# Patient Record
Sex: Male | Born: 1965 | Race: White | Hispanic: No | Marital: Married | Smoking: Never smoker
Health system: Southern US, Community
[De-identification: ages and names within clinical notes are randomized; demographics above are authoritative.]

## PROBLEM LIST (undated history)

## (undated) DIAGNOSIS — Z9689 Presence of other specified functional implants: Secondary | ICD-10-CM

## (undated) DIAGNOSIS — M1990 Unspecified osteoarthritis, unspecified site: Secondary | ICD-10-CM

## (undated) DIAGNOSIS — F329 Major depressive disorder, single episode, unspecified: Secondary | ICD-10-CM

## (undated) DIAGNOSIS — N529 Male erectile dysfunction, unspecified: Secondary | ICD-10-CM

## (undated) DIAGNOSIS — Z9889 Other specified postprocedural states: Secondary | ICD-10-CM

## (undated) DIAGNOSIS — I890 Lymphedema, not elsewhere classified: Secondary | ICD-10-CM

## (undated) DIAGNOSIS — F988 Other specified behavioral and emotional disorders with onset usually occurring in childhood and adolescence: Secondary | ICD-10-CM

## (undated) DIAGNOSIS — R351 Nocturia: Secondary | ICD-10-CM

## (undated) DIAGNOSIS — N400 Enlarged prostate without lower urinary tract symptoms: Secondary | ICD-10-CM

## (undated) DIAGNOSIS — F319 Bipolar disorder, unspecified: Secondary | ICD-10-CM

## (undated) DIAGNOSIS — I10 Essential (primary) hypertension: Secondary | ICD-10-CM

## (undated) DIAGNOSIS — G629 Polyneuropathy, unspecified: Secondary | ICD-10-CM

## (undated) DIAGNOSIS — K219 Gastro-esophageal reflux disease without esophagitis: Secondary | ICD-10-CM

## (undated) DIAGNOSIS — G4730 Sleep apnea, unspecified: Secondary | ICD-10-CM

## (undated) DIAGNOSIS — M5030 Other cervical disc degeneration, unspecified cervical region: Secondary | ICD-10-CM

## (undated) DIAGNOSIS — E785 Hyperlipidemia, unspecified: Secondary | ICD-10-CM

## (undated) DIAGNOSIS — F419 Anxiety disorder, unspecified: Secondary | ICD-10-CM

## (undated) DIAGNOSIS — F32A Depression, unspecified: Secondary | ICD-10-CM

## (undated) DIAGNOSIS — E291 Testicular hypofunction: Secondary | ICD-10-CM

## (undated) DIAGNOSIS — E669 Obesity, unspecified: Secondary | ICD-10-CM

## (undated) DIAGNOSIS — B356 Tinea cruris: Secondary | ICD-10-CM

## (undated) DIAGNOSIS — M109 Gout, unspecified: Secondary | ICD-10-CM

## (undated) HISTORY — DX: Hyperlipidemia, unspecified: E78.5

## (undated) HISTORY — DX: Testicular hypofunction: E29.1

## (undated) HISTORY — DX: Gout, unspecified: M10.9

## (undated) HISTORY — PX: TOE SURGERY: SHX1073

## (undated) HISTORY — PX: STOMACH SURGERY: SHX791

## (undated) HISTORY — DX: Bipolar disorder, unspecified: F31.9

## (undated) HISTORY — DX: Benign prostatic hyperplasia without lower urinary tract symptoms: N40.0

## (undated) HISTORY — DX: Tinea cruris: B35.6

## (undated) HISTORY — DX: Sleep apnea, unspecified: G47.30

## (undated) HISTORY — DX: Gastro-esophageal reflux disease without esophagitis: K21.9

## (undated) HISTORY — PX: JOINT REPLACEMENT: SHX530

## (undated) HISTORY — PX: BACK SURGERY: SHX140

## (undated) HISTORY — DX: Essential (primary) hypertension: I10

## (undated) HISTORY — DX: Other specified behavioral and emotional disorders with onset usually occurring in childhood and adolescence: F98.8

## (undated) HISTORY — DX: Unspecified osteoarthritis, unspecified site: M19.90

## (undated) HISTORY — DX: Other cervical disc degeneration, unspecified cervical region: M50.30

## (undated) HISTORY — PX: LUMBAR DISC SURGERY: SHX700

## (undated) HISTORY — DX: Obesity, unspecified: E66.9

## (undated) HISTORY — DX: Male erectile dysfunction, unspecified: N52.9

## (undated) HISTORY — DX: Nocturia: R35.1

## (undated) HISTORY — PX: CARDIAC CATHETERIZATION: SHX172

## (undated) HISTORY — PX: WRIST SURGERY: SHX841

---

## 2005-04-17 ENCOUNTER — Emergency Department: Payer: Self-pay | Admitting: Emergency Medicine

## 2005-05-16 ENCOUNTER — Ambulatory Visit: Payer: Self-pay | Admitting: Pain Medicine

## 2005-05-24 ENCOUNTER — Ambulatory Visit: Payer: Self-pay | Admitting: Pain Medicine

## 2005-06-05 ENCOUNTER — Ambulatory Visit: Payer: Self-pay | Admitting: Pain Medicine

## 2005-07-03 ENCOUNTER — Ambulatory Visit: Payer: Self-pay | Admitting: Pain Medicine

## 2005-07-20 ENCOUNTER — Ambulatory Visit: Payer: Self-pay | Admitting: Physician Assistant

## 2005-07-24 ENCOUNTER — Ambulatory Visit: Payer: Self-pay | Admitting: Pain Medicine

## 2005-08-22 ENCOUNTER — Ambulatory Visit: Payer: Self-pay | Admitting: Pain Medicine

## 2005-08-24 ENCOUNTER — Ambulatory Visit: Payer: Self-pay | Admitting: Pain Medicine

## 2005-09-06 ENCOUNTER — Ambulatory Visit: Payer: Self-pay | Admitting: Pain Medicine

## 2005-09-25 ENCOUNTER — Ambulatory Visit: Payer: Self-pay | Admitting: Pain Medicine

## 2005-10-26 ENCOUNTER — Ambulatory Visit: Payer: Self-pay | Admitting: Pain Medicine

## 2005-11-21 ENCOUNTER — Ambulatory Visit: Payer: Self-pay | Admitting: Pain Medicine

## 2005-11-27 ENCOUNTER — Ambulatory Visit: Payer: Self-pay | Admitting: Pain Medicine

## 2005-12-21 ENCOUNTER — Ambulatory Visit: Payer: Self-pay | Admitting: Pain Medicine

## 2005-12-25 ENCOUNTER — Ambulatory Visit: Payer: Self-pay | Admitting: Pain Medicine

## 2006-01-23 ENCOUNTER — Ambulatory Visit: Payer: Self-pay | Admitting: Pain Medicine

## 2006-01-31 ENCOUNTER — Ambulatory Visit: Payer: Self-pay | Admitting: Pain Medicine

## 2006-02-22 ENCOUNTER — Ambulatory Visit: Payer: Self-pay | Admitting: Pain Medicine

## 2006-02-28 ENCOUNTER — Ambulatory Visit: Payer: Self-pay | Admitting: Pain Medicine

## 2006-03-07 ENCOUNTER — Ambulatory Visit: Payer: Self-pay | Admitting: Pain Medicine

## 2006-03-22 ENCOUNTER — Ambulatory Visit: Payer: Self-pay | Admitting: Pain Medicine

## 2006-04-02 ENCOUNTER — Ambulatory Visit: Payer: Self-pay | Admitting: Pain Medicine

## 2006-04-24 ENCOUNTER — Ambulatory Visit: Payer: Self-pay | Admitting: Pain Medicine

## 2006-05-24 ENCOUNTER — Ambulatory Visit: Payer: Self-pay | Admitting: Pain Medicine

## 2006-06-04 ENCOUNTER — Ambulatory Visit: Payer: Self-pay | Admitting: Pain Medicine

## 2006-06-19 ENCOUNTER — Ambulatory Visit: Payer: Self-pay | Admitting: Pain Medicine

## 2006-07-19 ENCOUNTER — Ambulatory Visit: Payer: Self-pay | Admitting: Pain Medicine

## 2006-07-30 ENCOUNTER — Ambulatory Visit: Payer: Self-pay | Admitting: Pain Medicine

## 2006-08-23 ENCOUNTER — Ambulatory Visit: Payer: Self-pay | Admitting: Pain Medicine

## 2006-09-05 ENCOUNTER — Ambulatory Visit: Payer: Self-pay | Admitting: Pain Medicine

## 2006-09-06 ENCOUNTER — Ambulatory Visit: Payer: Self-pay | Admitting: Family Medicine

## 2006-09-18 ENCOUNTER — Ambulatory Visit: Payer: Self-pay | Admitting: Pain Medicine

## 2006-10-16 ENCOUNTER — Ambulatory Visit: Payer: Self-pay | Admitting: Pain Medicine

## 2006-10-24 ENCOUNTER — Ambulatory Visit: Payer: Self-pay | Admitting: Pain Medicine

## 2006-11-13 ENCOUNTER — Ambulatory Visit: Payer: Self-pay | Admitting: Pain Medicine

## 2006-12-18 ENCOUNTER — Ambulatory Visit: Payer: Self-pay | Admitting: Pain Medicine

## 2006-12-26 ENCOUNTER — Ambulatory Visit: Payer: Self-pay | Admitting: Pain Medicine

## 2007-01-17 ENCOUNTER — Ambulatory Visit: Payer: Self-pay | Admitting: Pain Medicine

## 2007-01-21 ENCOUNTER — Ambulatory Visit: Payer: Self-pay | Admitting: Pain Medicine

## 2007-02-12 ENCOUNTER — Ambulatory Visit: Payer: Self-pay | Admitting: Pain Medicine

## 2007-03-14 ENCOUNTER — Ambulatory Visit: Payer: Self-pay | Admitting: Pain Medicine

## 2007-04-10 ENCOUNTER — Ambulatory Visit: Payer: Self-pay | Admitting: Pain Medicine

## 2007-05-09 ENCOUNTER — Ambulatory Visit: Payer: Self-pay | Admitting: Pain Medicine

## 2007-05-13 ENCOUNTER — Ambulatory Visit: Payer: Self-pay | Admitting: Pain Medicine

## 2007-06-13 ENCOUNTER — Ambulatory Visit: Payer: Self-pay | Admitting: Pain Medicine

## 2007-07-17 ENCOUNTER — Ambulatory Visit: Payer: Self-pay | Admitting: Pain Medicine

## 2007-08-13 ENCOUNTER — Ambulatory Visit: Payer: Self-pay | Admitting: Pain Medicine

## 2007-09-10 ENCOUNTER — Ambulatory Visit: Payer: Self-pay | Admitting: Pain Medicine

## 2007-09-18 ENCOUNTER — Ambulatory Visit: Payer: Self-pay | Admitting: Pain Medicine

## 2007-09-30 ENCOUNTER — Ambulatory Visit: Payer: Self-pay | Admitting: Pain Medicine

## 2007-10-10 ENCOUNTER — Ambulatory Visit: Payer: Self-pay | Admitting: Pain Medicine

## 2007-10-18 ENCOUNTER — Ambulatory Visit: Payer: Self-pay | Admitting: Pain Medicine

## 2007-11-12 ENCOUNTER — Ambulatory Visit: Payer: Self-pay | Admitting: Pain Medicine

## 2007-12-09 ENCOUNTER — Ambulatory Visit: Payer: Self-pay | Admitting: Internal Medicine

## 2007-12-16 ENCOUNTER — Ambulatory Visit: Payer: Self-pay | Admitting: Pain Medicine

## 2008-01-09 ENCOUNTER — Ambulatory Visit: Payer: Self-pay | Admitting: Pain Medicine

## 2008-01-13 ENCOUNTER — Ambulatory Visit: Payer: Self-pay | Admitting: Pain Medicine

## 2008-01-24 ENCOUNTER — Other Ambulatory Visit: Payer: Self-pay

## 2008-01-24 ENCOUNTER — Ambulatory Visit: Payer: Self-pay | Admitting: Unknown Physician Specialty

## 2008-01-31 ENCOUNTER — Ambulatory Visit: Payer: Self-pay | Admitting: Unknown Physician Specialty

## 2008-02-11 ENCOUNTER — Ambulatory Visit: Payer: Self-pay | Admitting: Pain Medicine

## 2009-02-25 ENCOUNTER — Ambulatory Visit: Payer: Self-pay | Admitting: Unknown Physician Specialty

## 2009-08-18 ENCOUNTER — Ambulatory Visit: Payer: Self-pay | Admitting: Pain Medicine

## 2009-09-09 ENCOUNTER — Ambulatory Visit: Payer: Self-pay | Admitting: Pain Medicine

## 2009-09-13 ENCOUNTER — Ambulatory Visit: Payer: Self-pay | Admitting: Pain Medicine

## 2009-10-05 ENCOUNTER — Ambulatory Visit: Payer: Self-pay | Admitting: Pain Medicine

## 2009-10-11 ENCOUNTER — Ambulatory Visit: Payer: Self-pay | Admitting: Pain Medicine

## 2009-11-01 ENCOUNTER — Ambulatory Visit: Payer: Self-pay | Admitting: Pain Medicine

## 2009-11-30 ENCOUNTER — Ambulatory Visit: Payer: Self-pay | Admitting: Pain Medicine

## 2009-12-01 ENCOUNTER — Ambulatory Visit: Payer: Self-pay | Admitting: Pain Medicine

## 2009-12-30 ENCOUNTER — Ambulatory Visit: Payer: Self-pay | Admitting: Pain Medicine

## 2010-01-03 ENCOUNTER — Ambulatory Visit: Payer: Self-pay | Admitting: Pain Medicine

## 2010-01-31 ENCOUNTER — Ambulatory Visit: Payer: Self-pay | Admitting: Pain Medicine

## 2010-03-01 ENCOUNTER — Ambulatory Visit: Payer: Self-pay | Admitting: Pain Medicine

## 2010-03-31 ENCOUNTER — Ambulatory Visit: Payer: Self-pay | Admitting: Pain Medicine

## 2010-04-01 ENCOUNTER — Ambulatory Visit: Payer: Self-pay

## 2010-04-06 ENCOUNTER — Ambulatory Visit: Payer: Self-pay | Admitting: Pain Medicine

## 2010-05-05 ENCOUNTER — Ambulatory Visit: Payer: Self-pay | Admitting: Pain Medicine

## 2010-05-10 ENCOUNTER — Ambulatory Visit: Payer: Self-pay | Admitting: Unknown Physician Specialty

## 2010-05-19 ENCOUNTER — Ambulatory Visit: Payer: Self-pay | Admitting: Unknown Physician Specialty

## 2010-06-12 ENCOUNTER — Ambulatory Visit: Payer: Self-pay | Admitting: Internal Medicine

## 2011-04-27 ENCOUNTER — Ambulatory Visit: Payer: Self-pay | Admitting: Surgery

## 2012-08-14 HISTORY — PX: TOTAL KNEE ARTHROPLASTY: SHX125

## 2012-09-17 ENCOUNTER — Ambulatory Visit: Payer: Self-pay | Admitting: Cardiology

## 2012-10-07 ENCOUNTER — Ambulatory Visit: Payer: Self-pay | Admitting: Family Medicine

## 2012-10-07 DIAGNOSIS — K760 Fatty (change of) liver, not elsewhere classified: Secondary | ICD-10-CM | POA: Insufficient documentation

## 2012-10-07 DIAGNOSIS — I2510 Atherosclerotic heart disease of native coronary artery without angina pectoris: Secondary | ICD-10-CM | POA: Insufficient documentation

## 2012-10-23 ENCOUNTER — Ambulatory Visit: Payer: Self-pay | Admitting: Gastroenterology

## 2013-05-05 ENCOUNTER — Encounter: Payer: Self-pay | Admitting: Orthopedic Surgery

## 2013-06-04 ENCOUNTER — Ambulatory Visit: Payer: Self-pay | Admitting: Orthopedic Surgery

## 2013-09-28 ENCOUNTER — Inpatient Hospital Stay: Payer: Self-pay | Admitting: Student

## 2013-09-28 LAB — CBC
HCT: 43.8 % (ref 40.0–52.0)
HGB: 14.7 g/dL (ref 13.0–18.0)
MCH: 31.6 pg (ref 26.0–34.0)
MCHC: 33.7 g/dL (ref 32.0–36.0)
MCV: 94 fL (ref 80–100)
PLATELETS: 361 10*3/uL (ref 150–440)
RBC: 4.66 10*6/uL (ref 4.40–5.90)
RDW: 13.6 % (ref 11.5–14.5)
WBC: 9.7 10*3/uL (ref 3.8–10.6)

## 2013-09-28 LAB — COMPREHENSIVE METABOLIC PANEL
ALBUMIN: 3.3 g/dL — AB (ref 3.4–5.0)
ALT: 28 U/L (ref 12–78)
Alkaline Phosphatase: 53 U/L
Anion Gap: 5 — ABNORMAL LOW (ref 7–16)
BUN: 8 mg/dL (ref 7–18)
Bilirubin,Total: 0.5 mg/dL (ref 0.2–1.0)
CREATININE: 1.19 mg/dL (ref 0.60–1.30)
Calcium, Total: 9.4 mg/dL (ref 8.5–10.1)
Chloride: 101 mmol/L (ref 98–107)
Co2: 27 mmol/L (ref 21–32)
EGFR (African American): >60
EGFR (Non-African Amer.): >60
Glucose: 109 mg/dL — ABNORMAL HIGH (ref 65–99)
Osmolality: 265 (ref 275–301)
POTASSIUM: 4.1 mmol/L (ref 3.5–5.1)
SGOT(AST): 29 U/L (ref 15–37)
SODIUM: 133 mmol/L — AB (ref 136–145)
TOTAL PROTEIN: 7.9 g/dL (ref 6.4–8.2)

## 2013-09-29 LAB — CBC WITH DIFFERENTIAL/PLATELET
BASOS PCT: 1.6 %
Basophil #: 0.1 10*3/uL (ref 0.0–0.1)
Eosinophil #: 0.5 10*3/uL (ref 0.0–0.7)
Eosinophil %: 6.6 %
HCT: 41.3 % (ref 40.0–52.0)
HGB: 14.2 g/dL (ref 13.0–18.0)
LYMPHS ABS: 1.8 10*3/uL (ref 1.0–3.6)
Lymphocyte %: 22.5 %
MCH: 32.3 pg (ref 26.0–34.0)
MCHC: 34.3 g/dL (ref 32.0–36.0)
MCV: 94 fL (ref 80–100)
MONO ABS: 0.6 x10 3/mm (ref 0.2–1.0)
Monocyte %: 8 %
Neutrophil #: 4.9 10*3/uL (ref 1.4–6.5)
Neutrophil %: 61.3 %
PLATELETS: 339 10*3/uL (ref 150–440)
RBC: 4.39 10*6/uL — ABNORMAL LOW (ref 4.40–5.90)
RDW: 13.4 % (ref 11.5–14.5)
WBC: 8 10*3/uL (ref 3.8–10.6)

## 2013-09-29 LAB — BASIC METABOLIC PANEL
ANION GAP: 6 — AB (ref 7–16)
BUN: 7 mg/dL (ref 7–18)
CO2: 25 mmol/L (ref 21–32)
Calcium, Total: 8.8 mg/dL (ref 8.5–10.1)
Chloride: 104 mmol/L (ref 98–107)
Creatinine: 1.04 mg/dL (ref 0.60–1.30)
EGFR (Non-African Amer.): >60
GLUCOSE: 129 mg/dL — AB (ref 65–99)
Osmolality: 270 (ref 275–301)
Potassium: 4 mmol/L (ref 3.5–5.1)
Sodium: 135 mmol/L — ABNORMAL LOW (ref 136–145)

## 2013-10-03 LAB — CULTURE, BLOOD (SINGLE)

## 2013-10-13 DIAGNOSIS — G4733 Obstructive sleep apnea (adult) (pediatric): Secondary | ICD-10-CM | POA: Insufficient documentation

## 2013-10-21 ENCOUNTER — Emergency Department: Payer: Self-pay | Admitting: Emergency Medicine

## 2014-02-23 DIAGNOSIS — I890 Lymphedema, not elsewhere classified: Secondary | ICD-10-CM | POA: Insufficient documentation

## 2014-06-12 ENCOUNTER — Ambulatory Visit: Payer: Self-pay | Admitting: Gastroenterology

## 2014-08-28 ENCOUNTER — Ambulatory Visit: Payer: Self-pay

## 2014-09-18 ENCOUNTER — Ambulatory Visit (INDEPENDENT_AMBULATORY_CARE_PROVIDER_SITE_OTHER): Payer: BC Managed Care – PPO | Admitting: Podiatry

## 2014-09-18 ENCOUNTER — Encounter: Payer: Self-pay | Admitting: Podiatry

## 2014-09-18 ENCOUNTER — Ambulatory Visit: Payer: Self-pay

## 2014-09-18 ENCOUNTER — Ambulatory Visit (INDEPENDENT_AMBULATORY_CARE_PROVIDER_SITE_OTHER): Payer: BC Managed Care – PPO

## 2014-09-18 VITALS — BP 152/89 | HR 96 | Resp 16

## 2014-09-18 DIAGNOSIS — M79676 Pain in unspecified toe(s): Secondary | ICD-10-CM

## 2014-09-18 DIAGNOSIS — L84 Corns and callosities: Secondary | ICD-10-CM

## 2014-09-18 DIAGNOSIS — M2042 Other hammer toe(s) (acquired), left foot: Secondary | ICD-10-CM

## 2014-09-20 NOTE — Progress Notes (Signed)
Subjective:     Patient ID: Rodney Howard Rodney Howard, male   DOB: 1966-02-09, 49 y.o.   MRN: [id number]  HPI patient presents with chronic lesions at the tips of the third and fourth toe left over right foot was structural deviation of the digits noted.   Review of Systems     Objective:   Physical Exam No change neurovascular status significant structural deformity of the lesser digits noted with distal contracture and distal keratotic lesion left over right    Assessment:     Hammertoe deformity with chronic keratotic lesion secondary to foot structure    Plan:     Educated on condition and consideration at one point for digital fusion. Today debrided the lesions fully and applied buttress pad to try to lift the toes and we will see how this does for her. Reappoint to recheck in several months

## 2014-11-16 ENCOUNTER — Encounter: Payer: Self-pay | Admitting: Podiatry

## 2014-11-16 ENCOUNTER — Ambulatory Visit (INDEPENDENT_AMBULATORY_CARE_PROVIDER_SITE_OTHER): Payer: BC Managed Care – PPO | Admitting: Podiatry

## 2014-11-16 VITALS — BP 151/91 | HR 86 | Resp 16 | Ht 75.0 in | Wt 360.0 lb

## 2014-11-16 DIAGNOSIS — L600 Ingrowing nail: Secondary | ICD-10-CM | POA: Diagnosis not present

## 2014-11-16 NOTE — Progress Notes (Signed)
He presents today with chief complaint of a painful ingrown toenail fibular border of the second digit right foot. He denies fever chills nausea vomiting muscle aches and pains. He does state that he is 1 week away from having a total knee replaced in his left leg.  Objective: Vital signs are stable he is alert and oriented 3 pulses to the right foot is intact there is mild erythema with a sharp incurvated nail margin along the fibular border of the second digit right foot. No purulence no malodor no drainage.  Assessment: Ingrown nail margin. Second digit right foot.  Plan: I removed the nail margin as a slant back second digit right foot because he is planning to have a knee arthroplasty we cannot risk a matrixectomy at this point. I will have him back in the future after he recovers from his knee replacement for matrixectomy.

## 2014-12-05 NOTE — Discharge Summary (Signed)
PATIENT NAME:  Rodney Howard, [name] MR#:  [id number] DATE OF BIRTH:  17-Apr-1966  DATE OF ADMISSION:  09/28/2013 DATE OF DISCHARGE:  09/30/2013  DISPOSITION:  Home.   DIAGNOSIS:  Cellulitis of the left lower extremity.   MEDICATIONS AT DISCHARGE:  Aspirin 81 mg daily, aripiprazole 15 mg daily, amitriptyline 200 mg daily, metoprolol 100 mg daily, Norco 10/325 mg one every 4 hours as needed for pain, tranylcypromine 10 mg take 2 tablets once a day. OxyContin 20 mg extended release every 12 hours, gabapentin 300 mg once a day, amoxicillin 875 mg twice daily for 10 days, and Cleocin 150 mg once a day.  FOLLOWUP:  Primary care physician, Dr. Rolm GalaHeidi Grandis.  HOSPITAL COURSE:  This is a very nice 49 year old gentleman with history of developing redness on the level of the ankle of the left lower extremity. No history of trauma. Erythema getting worse with accompany of edema, for which the patient went to urgent care. He received some Keflex. Keflex did not work, had cellulitic progression. The patient was sent to the Emergency Department. In the Emergency Department, he had an ultrasound of the lower extremities that did not show any significant DVTs. He has had a heart rate of 91 and a white blood cell count of 9.7. The patient was admitted for treatment of cellulitis. He was started on cefazolin and had significant improvement through his hospitalization. The patient is discharged on amoxicillin and Cleocin, as he was treated with Keflex previously with trimethoprim-sulfamethoxazole as an outpatient. I was going to give him doxycycline, but the patient had a previous stomach upset. The patient is discharged in the company of his wife. I spent about 35 minutes with the discharge today.    ____________________________ Felipa Furnaceoberto Sanchez Gutierrez, MD rsg:ms D: 09/30/2013 23:18:45 ET T: 09/30/2013 23:42:05 ET JOB#: [id number]  cc: Felipa Furnaceoberto Sanchez Gutierrez, MD, <Dictator> Letitia CaulHeidi M. Grandis, MD  [name] ELECTRONICALLY SIGNED 10/04/2013 20:34

## 2014-12-05 NOTE — H&P (Signed)
PATIENT NAME:  Rodney Howard, Rodney Howard MR#:  [id number] DATE OF BIRTH:  1966-05-06  DATE OF ADMISSION:  09/28/2013  PRIMARY CARE PHYSICIAN: Rodney CaulHeidi M. Grandis, MD  REFERRING PHYSICIAN: Su Leyobert L. Kinner, MD  CHIEF COMPLAINT: Left leg swelling.   HISTORY OF PRESENT ILLNESS:  The patient is a pleasant 49 year old male who initially started to have some redness above the ankle area on the left lower extremity. There is no trauma, insect bites or open sores but the redness increased and the following day patient had increased swelling. He went to an urgent care center and was started on Keflex.  The patient took 2 tablets, went to the PMD the next day who stopped the Keflex and put the patient on Bactrim. The patient has had Bactrim since Thursday but the redness is increasing. There is increased swelling. There is no open drainage but the patient feels fullness as well and low-grade fevers, chills. He came into the hospital. He was noted to have significant redness and warmth, was given Ancef and blood cultures were obtained and hospitalist services were contacted for further evaluation and management. The patient denies having any long flights or history of DVT or PEs in the past. The patient denies having any increased periods of inactivity more so than baseline but is unemployed, stays at home most of the time.   PAST MEDICAL HISTORY:  1.  History of chronic back pain status post remote multiple surgeries.  2.  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  3.  Hypertension.  4.  Hyperlipidemia.  5.  Depression.  6.  Gout.  7.  Obstructive sleep apnea, on CPAP.   SOCIAL HISTORY: No tobacco, alcohol or drug use. Is unemployed.   FAMILY HISTORY:  Diabetes, hypertension, cirrhosis and stroke running in the family.   OUTPATIENT MEDICATIONS:  Acetaminophen/hydrocodone 325/10 mg 1 tab every 4 hours as needed for pain, Afrin p.r.n. at bedtime, aripiprazole 15 mg at  bedtime, aspirin 81 mg daily, calcium plus vitamin Howard 2 tabs once a day, gabapentin 300 mg at bedtime, lamotrigine 200 mg at bedtime, metoprolol extended-release 100 mg once a day, omeprazole 40 mg daily, OxyContin 20 mg 1 tab every 12 hours, Provigil 200 mg 2 times a day. Tranylcypromine 10 mg tablets 20 mg in the morning, 20 mg in the afternoon, 40 mg at bedtime.   REVIEW OF SYSTEMS: CONSTITUTIONAL: About a 10-pound weight loss in the last couple of weeks, low-grade fevers and some chills.  EYES: No blurry vision or double vision.  ENT: No tinnitus or hearing loss.  RESPIRATORY: No cough, wheezing, shortness of breath, dyspnea on exertion or painful respirations.  CARDIOVASCULAR: No chest pain, no orthopnea. Has obstructive sleep apnea.  GASTROINTESTINAL: Positive for nausea. No vomiting, diarrhea or abdominal pain. No bloody stools or dark stools.  GENITOURINARY: Denies dysuria, hematuria.  HEMATOLOGIC AND LYMPHATIC: No anemia or easy bruising.  SKIN: Positive for rash as described above.  MUSCULOSKELETAL: Has chronic arthritis and back pain.  NEUROLOGIC: No focal weakness or numbness.  PSYCHIATRIC:  Has depression.   PHYSICAL EXAMINATION: VITAL SIGNS: Temperature on arrival 98, pulse rate 91, respiratory rate 18, blood pressure 161/76, O2 sat 99% on room air.  GENERAL: The patient is an obese male lying in bed, no obvious distress.  HEENT: Normocephalic, atraumatic. Pupils are equal and reactive. Anicteric sclerae, moist mucous membranes.  NECK: Supple. No thyroid tenderness. No cervical lymphadenopathy.  CARDIOVASCULAR: S1, S2 regular. No significant murmurs, rubs or  gallops.  LUNGS: Clear to auscultation without wheezing, rhonchi or rales.  ABDOMEN: Soft, nontender.  EXTREMITIES: The patient has significant rash, edema and warmth starting in the foot area and going behind the shin area but it is mostly below the knee. There is no open drainage. This area is warm.  NEUROLOGIC: No focal  weakness on numbness. Strength is 5 out of 5 in all extremities. Cranial nerves II through XII grossly intact.  PSYCHIATRIC: Awake, alert, oriented x 3.   LABORATORY, DIAGNOSTIC AND RADIOLOGIC DATA: White count of 9.7, hemoglobin 14.7, platelets 361. LFTs: Albumin was 3.3, otherwise within normal limits. BUN 8, creatinine 1.19, sodium 133, potassium 4.1. Tib-fib x-ray on the left no acute abnormalities.   ASSESSMENT AND PLAN: We have a pleasant 49 year old male with depression, hypertension, hyperlipidemia, who comes in with a rash. He does have history of gout but has no significant joint pains. He did state he took colchicine and swelling and redness did not go away. This rash is more consistent with cellulitis. Would start the patient on Ancef and obtain blood cultures. I suspect the patient did not take enough Keflex and then the medication was changed to Bactrim. He does have history of staph which was likely not methicillin-resistant Staphylococcus aureus as it did improved with Keflex per patient in the past. Would follow with the cultures. Would mark the area at this point for tracking purposes and also obtain ultrasound of the lower extremity on the left to rule out deep vein thrombosis. Would continue the depression medications at this time but I suspect the patient needs IV antibiotics. Would trend the redness through serial exams. The patient is full code.   TOTAL TIME SPENT: Is 45 minutes.    ____________________________ Rodney Eaton, MD sa:cs Howard: 09/28/2013 14:41:53 ET T: 09/28/2013 15:02:45 ET JOB#: [id number]  cc: Rodney Eaton, MD, <Dictator> Rodney Eaton MD ELECTRONICALLY SIGNED 10/17/2013 13:02

## 2015-03-16 ENCOUNTER — Other Ambulatory Visit: Payer: BC Managed Care – PPO

## 2015-03-16 DIAGNOSIS — E291 Testicular hypofunction: Secondary | ICD-10-CM

## 2015-03-17 ENCOUNTER — Telehealth: Payer: Self-pay | Admitting: *Deleted

## 2015-03-17 LAB — TESTOSTERONE: TESTOSTERONE: 308 ng/dL — AB (ref 348–1197)

## 2015-03-17 NOTE — Telephone Encounter (Signed)
Pt returned call to get specific testosterone level (308).

## 2015-03-17 NOTE — Telephone Encounter (Signed)
Left message w/pt's son Leta Jungling) asking him to have his father call - will call pt's mobile #.

## 2015-03-17 NOTE — Telephone Encounter (Signed)
Left a message on the patient's vm relaying the results of their recent labs.  Contact information was also given so that the patient may call back if they have any questions. 

## 2015-03-17 NOTE — Telephone Encounter (Signed)
-----   Message from Harle Battiest, PA-C sent at 03/17/2015  8:29 AM EDT ----- Testosterone is low.  He has an appointment on the 16th.  We can discuss this at the appointment.

## 2015-03-24 ENCOUNTER — Other Ambulatory Visit: Payer: Self-pay | Admitting: *Deleted

## 2015-03-24 ENCOUNTER — Encounter: Payer: Self-pay | Admitting: *Deleted

## 2015-03-30 ENCOUNTER — Encounter: Payer: Self-pay | Admitting: Urology

## 2015-03-30 ENCOUNTER — Ambulatory Visit (INDEPENDENT_AMBULATORY_CARE_PROVIDER_SITE_OTHER): Payer: BC Managed Care – PPO | Admitting: Urology

## 2015-03-30 VITALS — BP 121/75 | HR 93 | Ht 74.0 in | Wt 345.9 lb

## 2015-03-30 DIAGNOSIS — N400 Enlarged prostate without lower urinary tract symptoms: Secondary | ICD-10-CM | POA: Diagnosis not present

## 2015-03-30 DIAGNOSIS — E291 Testicular hypofunction: Secondary | ICD-10-CM

## 2015-03-30 LAB — BLADDER SCAN AMB NON-IMAGING: Scan Result: 0

## 2015-03-30 MED ORDER — TESTOSTERONE CYPIONATE 200 MG/ML IM SOLN
200.0000 mg | Freq: Once | INTRAMUSCULAR | Status: AC
  Administered 2015-03-30: 200 mg via INTRAMUSCULAR

## 2015-03-30 NOTE — Progress Notes (Signed)
03/30/2015 4:23 PM   Rodney Howard July 08, 1966 [id number]  Referring provider: Rolm Gala, MD [address] [place], [place] [address]  Chief Complaint  Patient presents with  . Hypogonadism    recheck testosterone  . Benign Prostatic Hypertrophy    HPI: Rodney Howard is a 49 year old white male with hypogonadism who presents today to discuss his current mode of therapy and its effectiveness.    The patient has been tried on gels, long acting testosterone injections, pellets and testosterone cypionate. He most recently had been on AndroGel after switching therapies from the short acting testosterone injections due to an upcoming knee surgery. Patient felt he would not be able to return to the clinic for his injections while recovering from the knee surgery.   He is now fully recovered from the surgery and does not feel that the topical treatment has been effective.  His current serum testosterone level was 308 ng/dL drawn on 19/14/7829 at 8:30 in the morning. He has filled out an ADAM questionnaire and has answered yes to all questions, for the exception of a decrease in strength and loss of height. His major concern today is his lack of energy. He stated he felt the best on the short acting testosterone injections that he would like to return to that treatment modality.      Androgen Deficiency in the Aging Male      03/30/15 1600       Androgen Deficiency in the Aging Male   Do you have a decrease in libido (sex drive) Yes     Do you have lack of energy Yes     Do you have a decrease in strength and/or endurance No     Have you lost height No     Have you noticed a decreased "enjoyment of life" Yes     Are you sad and/or grumpy Yes     Are your erections less strong Yes     Have you noticed a recent deterioration in your ability to play sports Yes     Are you falling asleep after dinner Yes     Has there been a recent deterioration in your work performance Yes          Patient is currently scheduled to undergo lap band surgery next week.  He is also completed an IPSS score sheet and has scored 9 which is moderate symptomatology, but he is pleased with his lower urinary tract symptoms at this time and his postvoid residual is 0 mL.      IPSS      03/30/15 1600       International Prostate Symptom Score   How often have you had the sensation of not emptying your bladder? Not at All     How often have you had to urinate less than every two hours? About half the time     How often have you found you stopped and started again several times when you urinated? Less than 1 in 5 times     How often have you found it difficult to postpone urination? Less than 1 in 5 times     How often have you had a weak urinary stream? Less than 1 in 5 times     How often have you had to strain to start urination? Not at All     How many times did you typically get up at night to urinate? 3 Times  Total IPSS Score 9     Quality of Life due to urinary symptoms   If you were to spend the rest of your life with your urinary condition just the way it is now how would you feel about that? Pleased        Score:  1-7 Mild 8-19 Moderate 20-35 Severe  He has not had any recent fevers, chills, nausea or vomiting. He also denies any gross hematuria, dysuria or suprapubic pain.   PMH: Past Medical History  Diagnosis Date  . Obesity   . GERD (gastroesophageal reflux disease)   . Bipolar 1 disorder   . HLD (hyperlipidemia)   . Sleep apnea   . ADD (attention deficit disorder)   . HTN (hypertension)   . Gout   . Osteoarthritis   . DDD (degenerative disc disease), cervical   . BPH (benign prostatic hyperplasia)   . Hypogonadism in male   . Nocturia   . Erectile dysfunction   . Tinea cruris   . Obesity     Surgical History: Past Surgical History  Procedure Laterality Date  . Total knee arthroplasty Left 2014  . Toe surgery    . Wrist surgery    . Lumbar  disc surgery      x 3    Home Medications:    Medication List       This list is accurate as of: 03/30/15  4:23 PM.  Always use your most recent med list.               allopurinol 100 MG tablet  Commonly known as:  ZYLOPRIM  TAKE 1 TABLET (100 MG TOTAL) BY MOUTH TWO (2) TIMES A DAY.     allopurinol 300 MG tablet  Commonly known as:  ZYLOPRIM  TAKE 1 TABLET(S) EVERY DAY BY ORAL ROUTE FOR 30 DAYS.     AMITIZA 24 MCG capsule  Generic drug:  lubiprostone  Take by mouth.     ANDROGEL PUMP 20.25 MG/ACT (1.62%) Gel  Generic drug:  Testosterone     ARIPiprazole 15 MG tablet  Commonly known as:  ABILIFY  Take by mouth.     aspirin 81 MG chewable tablet  Chew by mouth.     bumetanide 1 MG tablet  Commonly known as:  BUMEX  Take by mouth.     cephALEXin 500 MG capsule  Commonly known as:  KEFLEX     Cholecalciferol 2000 UNITS Tabs  Take by mouth.     COLACE PO  Take by mouth.     colchicine 0.6 MG tablet  Take 1 mg by mouth 2 (two) times daily.     HYDROcodone-acetaminophen 10-325 MG per tablet  Commonly known as:  NORCO  Take by mouth.     ketoconazole 2 % cream  Commonly known as:  NIZORAL  APPLY TO AFFECTED AREA TWICE DAILY UNTIL RASH CLEARS     lamoTRIgine 200 MG tablet  Commonly known as:  LAMICTAL  Take 200 mg by mouth 2 (two) times daily.     lisinopril 10 MG tablet  Commonly known as:  PRINIVIL,ZESTRIL  Take by mouth.     lisinopril 20 MG tablet  Commonly known as:  PRINIVIL,ZESTRIL  TAKE 1 TABLET BY MOUTH EVERY DAY *NEED OFFICE VISIT FOR MORE REFILLLS     Lurasidone HCl 20 MG Tabs  Take by mouth.     metoprolol succinate 100 MG 24 hr tablet  Commonly known as:  TOPROL-XL  Take by mouth.  metoprolol succinate 100 MG 24 hr tablet  Commonly known as:  TOPROL-XL  TAKE 1 TABLET (100 MG TOTAL) BY MOUTH ONCE DAILY.     modafinil 200 MG tablet  Commonly known as:  PROVIGIL  Take by mouth.     nystatin cream  Commonly known as:   MYCOSTATIN     omeprazole 40 MG capsule  Commonly known as:  PRILOSEC  Take by mouth.     ondansetron 4 MG disintegrating tablet  Commonly known as:  ZOFRAN-ODT  TAKE ONE TABLET BY MOUTH EVERY 4-6 HOURS AS NEEDED     OxyCODONE 20 mg T12a 12 hr tablet  Commonly known as:  OXYCONTIN  Take by mouth.     oxyCODONE-acetaminophen 10-325 MG per tablet  Commonly known as:  PERCOCET  TAKE ONE TABLET EVERY 4-6 HOURS AS NEEDED     predniSONE 10 MG tablet  Commonly known as:  DELTASONE  TAPER AS DIRECTED - 6 X 4 DAYS, 4 X 4 DAYS, 2 X 4 DAYS     promethazine 6.25 MG/5ML syrup  Commonly known as:  PHENERGAN  TAKE 10 ML EVERY 6 HOURS BY MOUTH FOR 5 DAYS.     RA NAPROXEN SODIUM 220 MG tablet  Generic drug:  naproxen sodium  Take by mouth.     tranylcypromine 10 MG tablet  Commonly known as:  PARNATE  Take by mouth 2 (two) times daily.        Allergies:  Allergies  Allergen Reactions  . Atorvastatin Other (See Comments)    Leg pain and severe headaches  . Demerol [Meperidine]   . Statins     Family History: Family History  Problem Relation Age of Onset  . Arthritis Mother   . Deep vein thrombosis Mother   . Cirrhosis Sister   . Uterine cancer Mother   . Heart disease Father   . Heart disease Mother   . Diabetes Mellitus II Father     Mother, Sister  . Parkinson's disease Father   . Stroke Father   . Kidney disease Neg Hx   . Prostate cancer Neg Hx     Social History:  reports that he has never smoked. He does not have any smokeless tobacco history on file. He reports that he drinks alcohol. He reports that he does not use illicit drugs.  ROS: UROLOGY Frequent Urination?: No Hard to postpone urination?: No Burning/pain with urination?: No Get up at night to urinate?: Yes Leakage of urine?: No Urine stream starts and stops?: No Trouble starting stream?: No Do you have to strain to urinate?: No Blood in urine?: No Urinary tract infection?: No Sexually  transmitted disease?: No Injury to kidneys or bladder?: No Painful intercourse?: No Weak stream?: No Erection problems?: Yes Penile pain?: No  Gastrointestinal Nausea?: No Vomiting?: No Indigestion/heartburn?: Yes Diarrhea?: No Constipation?: Yes  Constitutional Fever: No Night sweats?: No Weight loss?: No Fatigue?: Yes  Skin Skin rash/lesions?: No Itching?: No  Eyes Blurred vision?: No Double vision?: No  Ears/Nose/Throat Sore throat?: No Sinus problems?: No  Hematologic/Lymphatic Swollen glands?: No Easy bruising?: No  Cardiovascular Leg swelling?: Yes Chest pain?: No  Respiratory Cough?: No Shortness of breath?: No  Endocrine Excessive thirst?: No  Musculoskeletal Back pain?: Yes Joint pain?: Yes  Neurological Headaches?: No Dizziness?: No  Psychologic Depression?: Yes Anxiety?: Yes  Physical Exam: BP 121/75 mmHg  Pulse 93  Ht 6\' 2"  (1.88 m)  Wt 345 lb 14.4 oz (156.899 kg)  BMI 44.39 kg/m2  GU: Patient with circumcised phallus.  Urethral meatus is patent.  No penile discharge. No penile lesions or rashes. Scrotum without lesions, cysts, rashes and/or edema.  Testicles are located scrotally bilaterally. No masses are appreciated in the testicles. The testicles are atrophic bilaterally. Left and right epididymis are normal. Rectal: Patient with  normal sphincter tone. Perineum without scarring or rashes. No rectal masses are appreciated. Prostate is approximately 45 (could not palpated entire gland due to buttocks tissue) grams, no nodules are appreciated. Seminal vesicles are normal.  Laboratory Data: Lab Results  Component Value Date   WBC 8.0 09/29/2013   HGB 14.2 09/29/2013   HCT 41.3 09/29/2013   MCV 94 09/29/2013   PLT 339 09/29/2013    Lab Results  Component Value Date   CREATININE 1.04 09/29/2013   PSA history:     0.5 ng/mL on 12/15/2013     0.6 ng/mL on 06/22/2014     0.5 ng/mL on 09/21/2014   Lab Results  Component  Value Date   TESTOSTERONE 308* 03/16/2015    No results found for: HGBA1C  Urinalysis No results found for: COLORURINE, APPEARANCEUR, LABSPEC, PHURINE, GLUCOSEU, HGBUR, BILIRUBINUR, KETONESUR, PROTEINUR, UROBILINOGEN, NITRITE, LEUKOCYTESUR  Pertinent Imaging: Results for orders placed or performed in visit on 03/30/15  BLADDER SCAN AMB NON-IMAGING  Result Value Ref Range   Scan Result 0     Assessment & Plan:    1. Hypogonadism in male:   Patient has been on several treatment modalities and has found that the short acting testosterone injections the most effective.  He feels that has given him the best improvement in his energy levels which is his biggest hypogonadal complaint.  He will receive a depot testosterone injection today.  He will have his total serum testosterone levels checked before 9 AM every 3 months.  His restarting dose will be 200 mg IM every 2 weeks.   - Hematocrit  2. BPH (benign prostatic hyperplasia) with LUTS:  Patient's IPSS score is 9/1.  His PVR 0 mL.  His DRE demonstrates enlargement.  Patient will continue with observation over time.  He will follow up in 6 months for a PSA, DRE, PVR and an IPSS.    - PSA - BLADDER SCAN AMB NON-IMAGING   No Follow-up on file.  Michiel Cowboy, PA-C  [facility] [address] [place], [place] [address] [phone]

## 2015-03-31 ENCOUNTER — Ambulatory Visit: Payer: BC Managed Care – PPO

## 2015-03-31 LAB — HEMATOCRIT: HEMATOCRIT: 44.1 % (ref 37.5–51.0)

## 2015-03-31 LAB — PSA: PROSTATE SPECIFIC AG, SERUM: 0.6 ng/mL (ref 0.0–4.0)

## 2015-04-04 ENCOUNTER — Encounter: Payer: Self-pay | Admitting: Urology

## 2015-04-05 ENCOUNTER — Telehealth: Payer: Self-pay | Admitting: Urology

## 2015-04-05 NOTE — Telephone Encounter (Signed)
Please advise 

## 2015-04-06 ENCOUNTER — Ambulatory Visit (INDEPENDENT_AMBULATORY_CARE_PROVIDER_SITE_OTHER): Payer: BC Managed Care – PPO

## 2015-04-06 DIAGNOSIS — E291 Testicular hypofunction: Secondary | ICD-10-CM

## 2015-04-06 MED ORDER — TESTOSTERONE CYPIONATE 200 MG/ML IM SOLN
200.0000 mg | Freq: Once | INTRAMUSCULAR | Status: AC
  Administered 2015-04-06: 200 mg via INTRAMUSCULAR

## 2015-04-06 MED ORDER — TESTOSTERONE CYPIONATE 200 MG/ML IM SOLN: 200.0000 mg | INTRAMUSCULAR | Status: DC

## 2015-04-06 NOTE — Progress Notes (Signed)
Testosterone IM Injection  Due to Hypogonadism patient is present today for a Testosterone Injection.  Medication: Testosterone Cypionate Dose: 1mL Location: right upper outer buttocks Lot: [id number] Exp:02/2016  Patient tolerated well, no complications were noted  Preformed by: Rupert Stacks, LPN  Follow up: Pt was given a script for testosterone refill.

## 2015-04-07 ENCOUNTER — Ambulatory Visit: Payer: BC Managed Care – PPO

## 2015-04-20 ENCOUNTER — Ambulatory Visit: Payer: BC Managed Care – PPO | Admitting: *Deleted

## 2015-04-20 DIAGNOSIS — E291 Testicular hypofunction: Secondary | ICD-10-CM

## 2015-04-20 NOTE — Progress Notes (Signed)
Testosterone IM Injection  Due to Hypogonadism patient is present today for a Testosterone Injection.  Medication: Testosterone Cypionate Dose: 1 ml Location: left upper outer buttocks Lot: [id number] Exp:02/2016  Patient tolerated well, no complications were noted  Preformed by: Natividad Brood, CMA   Follow up: 14 days

## 2015-04-22 DIAGNOSIS — Z9889 Other specified postprocedural states: Secondary | ICD-10-CM | POA: Insufficient documentation

## 2015-05-04 ENCOUNTER — Ambulatory Visit (INDEPENDENT_AMBULATORY_CARE_PROVIDER_SITE_OTHER): Payer: BC Managed Care – PPO

## 2015-05-04 DIAGNOSIS — E291 Testicular hypofunction: Secondary | ICD-10-CM | POA: Diagnosis not present

## 2015-05-04 MED ORDER — TESTOSTERONE CYPIONATE 200 MG/ML IM SOLN
200.0000 mg | Freq: Once | INTRAMUSCULAR | Status: AC
  Administered 2015-05-04: 200 mg via INTRAMUSCULAR

## 2015-05-04 NOTE — Progress Notes (Signed)
Testosterone IM Injection  Due to Hypogonadism patient is present today for a Testosterone Injection.  Medication: Testosterone Cypionate Dose: 1mL Location: left upper outer buttocks Lot: [id number] Exp:02/2016  Patient tolerated well, no complications were noted  Preformed by: Rupert Stacks, LPN  Follow up: 2 weeks

## 2015-05-05 NOTE — Telephone Encounter (Signed)
q 

## 2015-05-15 ENCOUNTER — Encounter: Payer: Self-pay | Admitting: Urology

## 2015-05-17 ENCOUNTER — Other Ambulatory Visit: Payer: Self-pay | Admitting: Urology

## 2015-05-17 MED ORDER — SILDENAFIL CITRATE 20 MG PO TABS: ORAL_TABLET | ORAL | Status: DC

## 2015-05-17 NOTE — Telephone Encounter (Signed)
LMOM that it was ok for patient to have a lab draw tomorrow but he will need to be here before 9 am because the Testosterone has to be drawn before 9 and that she would also print his RX request and he can pick it up tomorrow.

## 2015-05-17 NOTE — Telephone Encounter (Signed)
LMOM that patient could pick up rx when he comes in for lab draw on 05/18/15

## 2015-05-18 ENCOUNTER — Encounter: Payer: Self-pay | Admitting: Urology

## 2015-05-18 ENCOUNTER — Other Ambulatory Visit (INDEPENDENT_AMBULATORY_CARE_PROVIDER_SITE_OTHER): Payer: BC Managed Care – PPO | Admitting: *Deleted

## 2015-05-18 ENCOUNTER — Other Ambulatory Visit: Payer: BC Managed Care – PPO

## 2015-05-18 DIAGNOSIS — E291 Testicular hypofunction: Secondary | ICD-10-CM

## 2015-05-18 MED ORDER — TESTOSTERONE CYPIONATE 200 MG/ML IM SOLN
200.0000 mg | Freq: Once | INTRAMUSCULAR | Status: AC
  Administered 2015-05-18: 200 mg via INTRAMUSCULAR

## 2015-05-19 ENCOUNTER — Other Ambulatory Visit: Payer: Self-pay | Admitting: Urology

## 2015-05-19 ENCOUNTER — Encounter: Payer: Self-pay | Admitting: Urology

## 2015-05-19 ENCOUNTER — Telehealth: Payer: Self-pay | Admitting: *Deleted

## 2015-05-19 DIAGNOSIS — E291 Testicular hypofunction: Secondary | ICD-10-CM

## 2015-05-19 LAB — TESTOSTERONE: TESTOSTERONE: 325 ng/dL — AB (ref 348–1197)

## 2015-05-19 MED ORDER — TESTOSTERONE CYPIONATE 200 MG/ML IM SOLN: 200.0000 mg | INTRAMUSCULAR | Status: DC

## 2015-05-19 MED ORDER — TESTOSTERONE CYPIONATE 200 MG/ML IM SOLN: INTRAMUSCULAR | Status: DC

## 2015-05-19 MED ORDER — SILDENAFIL CITRATE 100 MG PO TABS: 100.0000 mg | ORAL_TABLET | Freq: Every day | ORAL | Status: DC | PRN

## 2015-05-19 NOTE — Telephone Encounter (Signed)
Will get shannon to change rx and will notify patient for pick up of rx

## 2015-05-19 NOTE — Telephone Encounter (Signed)
-----   Message from Harle Battiest, PA-C sent at 05/19/2015  9:12 AM EDT ----- Patient's testosterone is low. I would suggest increasing his testosterone dose to 300 mg IM every 2 weeks. We will then recheck a morning testosterone before 9 AM in 1 month.

## 2015-05-19 NOTE — Telephone Encounter (Signed)
Spoke to patient about results and new dosage, med sent to pharmacy, patient ok with plan.

## 2015-05-19 NOTE — Telephone Encounter (Signed)
Spoke with patient and gave results. Patient ok with plan and will recheck for blood work in one month before 9. Patient also wants refill called in for change in dosage. States [facility] gives him a hard time when we change the dosage. Medication updated per shannon message and sent to [facility].

## 2015-06-01 ENCOUNTER — Ambulatory Visit (INDEPENDENT_AMBULATORY_CARE_PROVIDER_SITE_OTHER): Payer: BC Managed Care – PPO

## 2015-06-01 DIAGNOSIS — E291 Testicular hypofunction: Secondary | ICD-10-CM

## 2015-06-01 MED ORDER — TESTOSTERONE CYPIONATE 200 MG/ML IM SOLN
200.0000 mg | Freq: Once | INTRAMUSCULAR | Status: AC
  Administered 2015-06-01: 200 mg via INTRAMUSCULAR

## 2015-06-01 NOTE — Progress Notes (Signed)
Testosterone IM Injection  Due to Hypogonadism patient is present today for a Testosterone Injection.  Medication: Testosterone Cypionate Dose: 1.315mL Location: left upper outer buttocks Lot: [id number] Exp:11/2016  Patient tolerated well, no complications were noted  Preformed by: Rupert Stackshelsea Elisah Parmer, LPN

## 2015-06-15 ENCOUNTER — Ambulatory Visit (INDEPENDENT_AMBULATORY_CARE_PROVIDER_SITE_OTHER): Payer: BC Managed Care – PPO

## 2015-06-15 DIAGNOSIS — E291 Testicular hypofunction: Secondary | ICD-10-CM

## 2015-06-15 MED ORDER — TESTOSTERONE CYPIONATE 200 MG/ML IM SOLN
200.0000 mg | Freq: Once | INTRAMUSCULAR | Status: AC
  Administered 2015-06-15: 200 mg via INTRAMUSCULAR

## 2015-06-15 NOTE — Progress Notes (Signed)
Testosterone IM Injection  Due to Hypogonadism patient is present today for a Testosterone Injection.  Medication: Testosterone Cypionate Dose: 1.455mL Location: right upper outer buttocks Lot: [id number] Exp:11/2016  Patient tolerated well, no complications were noted  Preformed by: Rupert Stackshelsea Osiah Haring, LPN   Follow up: pt had testosterone drawn this morning prior to injection.

## 2015-06-16 ENCOUNTER — Telehealth: Payer: Self-pay

## 2015-06-16 DIAGNOSIS — E291 Testicular hypofunction: Secondary | ICD-10-CM

## 2015-06-16 LAB — TESTOSTERONE: Testosterone: 803 ng/dL (ref 348–1197)

## 2015-06-16 NOTE — Telephone Encounter (Signed)
-----   Message from Harle BattiestShannon A McGowan, PA-C sent at 06/16/2015  8:31 AM EDT ----- Testosterone is normal. He will need morning testosterone (before 9AM), HCT and PSA and then an office visit with me in February.  Continue present dose.

## 2015-06-16 NOTE — Telephone Encounter (Signed)
Spoke with pt in reference to lab results. Pt voiced understanding. Pt made Feb lab and injection appt.

## 2015-06-29 ENCOUNTER — Ambulatory Visit (INDEPENDENT_AMBULATORY_CARE_PROVIDER_SITE_OTHER): Payer: BC Managed Care – PPO

## 2015-06-29 DIAGNOSIS — E291 Testicular hypofunction: Secondary | ICD-10-CM | POA: Diagnosis not present

## 2015-06-29 MED ORDER — TESTOSTERONE CYPIONATE 200 MG/ML IM SOLN
200.0000 mg | Freq: Once | INTRAMUSCULAR | Status: AC
  Administered 2015-06-29: 200 mg via INTRAMUSCULAR

## 2015-06-29 NOTE — Progress Notes (Signed)
Testosterone IM Injection  Due to Hypogonadism patient is present today for a Testosterone Injection.  Medication: Testosterone Cypionate Dose: 1.5mL Location: left upper outer buttocks Lot: [id number] Exp:11/2016  Patient tolerated well, no complications were noted  Preformed by: Chelsea Watkins, LPN    

## 2015-07-13 ENCOUNTER — Ambulatory Visit (INDEPENDENT_AMBULATORY_CARE_PROVIDER_SITE_OTHER): Payer: BC Managed Care – PPO

## 2015-07-13 DIAGNOSIS — E291 Testicular hypofunction: Secondary | ICD-10-CM

## 2015-07-13 MED ORDER — TESTOSTERONE CYPIONATE 200 MG/ML IM SOLN
200.0000 mg | Freq: Once | INTRAMUSCULAR | Status: AC
  Administered 2015-07-13: 200 mg via INTRAMUSCULAR

## 2015-07-13 NOTE — Progress Notes (Signed)
Testosterone IM Injection  Due to Hypogonadism patient is present today for a Testosterone Injection.  Medication: Testosterone Cypionate Dose: 1.415mL Location: right upper outer buttocks Lot: [id number] Exp:10/2016  Patient tolerated well, no complications were noted  Preformed by: Rupert Stackshelsea Chontel Warning, LPN

## 2015-07-16 ENCOUNTER — Telehealth: Payer: Self-pay | Admitting: Urology

## 2015-07-16 NOTE — Telephone Encounter (Signed)
Patient's nurse visit on 09/21/2015 should be changed to an office visit with me or Lillia AbedLindsay.  He is due for his 6 month follow up at that time.

## 2015-07-20 NOTE — Telephone Encounter (Signed)
SPOKE WITH PATIENT AND CHANGED APPT  Rodney Howard

## 2015-07-27 ENCOUNTER — Ambulatory Visit (INDEPENDENT_AMBULATORY_CARE_PROVIDER_SITE_OTHER): Payer: BC Managed Care – PPO

## 2015-07-27 ENCOUNTER — Telehealth: Payer: Self-pay | Admitting: Urology

## 2015-07-27 DIAGNOSIS — Z79899 Other long term (current) drug therapy: Secondary | ICD-10-CM

## 2015-07-27 DIAGNOSIS — E291 Testicular hypofunction: Secondary | ICD-10-CM | POA: Diagnosis not present

## 2015-07-27 MED ORDER — TESTOSTERONE CYPIONATE 200 MG/ML IM SOLN
200.0000 mg | Freq: Once | INTRAMUSCULAR | Status: AC
  Administered 2015-07-27: 200 mg via INTRAMUSCULAR

## 2015-07-27 NOTE — Telephone Encounter (Signed)
Will you also add a lipid panel to his blood work before his appointment in February?

## 2015-07-27 NOTE — Progress Notes (Signed)
Testosterone IM Injection  Due to Hypogonadism patient is present today for a Testosterone Injection.  Medication: Testosterone Cypionate Dose: 1.535mL Location: left upper outer buttocks Lot: [id number] Exp:10/2016  Patient tolerated well, no complications were noted  Preformed by: Rupert Stackshelsea Sarahi Borland, LPN   Follow up: 2 weeks

## 2015-08-10 ENCOUNTER — Ambulatory Visit (INDEPENDENT_AMBULATORY_CARE_PROVIDER_SITE_OTHER): Payer: BC Managed Care – PPO

## 2015-08-10 DIAGNOSIS — E291 Testicular hypofunction: Secondary | ICD-10-CM | POA: Diagnosis not present

## 2015-08-10 MED ORDER — TESTOSTERONE CYPIONATE 200 MG/ML IM SOLN
200.0000 mg | Freq: Once | INTRAMUSCULAR | Status: AC
  Administered 2015-08-10: 200 mg via INTRAMUSCULAR

## 2015-08-10 NOTE — Progress Notes (Signed)
Testosterone IM Injection  Due to Hypogonadism patient is present today for a Testosterone Injection.  Medication: Testosterone Cypionate Dose: 1.315mL Location: left upper outer buttocks Lot: [id number] Exp:10/2016  Patient tolerated well, no complications were noted  Preformed by: Rupert Stackshelsea Watkins, LPN

## 2015-08-18 ENCOUNTER — Telehealth: Payer: Self-pay | Admitting: Urology

## 2015-08-18 ENCOUNTER — Other Ambulatory Visit: Payer: Self-pay | Admitting: Urology

## 2015-08-18 NOTE — Telephone Encounter (Signed)
Pt called & LM on VM.  He wants to know if he has any refills on his prescription or does he need a new prescription?  If he needs a new one, can you please send it to [facility] in [place].  Also, pt asked how much does he have left of his current vial here in the office?  Please call.

## 2015-08-18 NOTE — Telephone Encounter (Signed)
Please tell Mr. Nydia BoutonGearhart he is good and that there is half the vial left.

## 2015-08-19 NOTE — Telephone Encounter (Signed)
Spoke with pt in reference to medication. Pt stated Carollee HerterShannon sent him a [employer] [facility] [facility].

## 2015-08-24 ENCOUNTER — Ambulatory Visit: Payer: BC Managed Care – PPO

## 2015-08-24 ENCOUNTER — Ambulatory Visit (INDEPENDENT_AMBULATORY_CARE_PROVIDER_SITE_OTHER): Payer: BC Managed Care – PPO

## 2015-08-24 DIAGNOSIS — E291 Testicular hypofunction: Secondary | ICD-10-CM | POA: Diagnosis not present

## 2015-08-24 MED ORDER — TESTOSTERONE CYPIONATE 200 MG/ML IM SOLN
200.0000 mg | Freq: Once | INTRAMUSCULAR | Status: AC
  Administered 2015-08-24: 200 mg via INTRAMUSCULAR

## 2015-08-24 NOTE — Progress Notes (Signed)
Testosterone IM Injection  Due to Hypogonadism patient is present today for a Testosterone Injection.  Medication: Testosterone Cypionate Dose: 1.5mL Location: right upper outer buttocks Lot: [id number] Exp:10/2016  Patient tolerated well, no complications were noted  Preformed by: Chelsea Watkins, LPN    

## 2015-08-25 ENCOUNTER — Ambulatory Visit: Payer: BC Managed Care – PPO

## 2015-09-07 ENCOUNTER — Ambulatory Visit (INDEPENDENT_AMBULATORY_CARE_PROVIDER_SITE_OTHER): Payer: BC Managed Care – PPO

## 2015-09-07 DIAGNOSIS — E291 Testicular hypofunction: Secondary | ICD-10-CM | POA: Diagnosis not present

## 2015-09-07 MED ORDER — TESTOSTERONE CYPIONATE 200 MG/ML IM SOLN
200.0000 mg | Freq: Once | INTRAMUSCULAR | Status: AC
  Administered 2015-09-07: 200 mg via INTRAMUSCULAR

## 2015-09-07 NOTE — Progress Notes (Signed)
Testosterone IM Injection  Due to Hypogonadism patient is present today for a Testosterone Injection.  Medication: Testosterone Cypionate Dose: 1.5mL Location: left upper outer buttocks Lot: [id number] Exp:10/2016  Patient tolerated well, no complications were noted  Preformed by: Hurshel Bouillon, LPN    

## 2015-09-20 ENCOUNTER — Other Ambulatory Visit: Payer: BC Managed Care – PPO

## 2015-09-20 DIAGNOSIS — Z79899 Other long term (current) drug therapy: Secondary | ICD-10-CM

## 2015-09-20 DIAGNOSIS — E291 Testicular hypofunction: Secondary | ICD-10-CM

## 2015-09-21 ENCOUNTER — Ambulatory Visit: Payer: Self-pay | Admitting: Urology

## 2015-09-21 LAB — LIPID PANEL
CHOL/HDL RATIO: 4.6 ratio (ref 0.0–5.0)
CHOLESTEROL TOTAL: 164 mg/dL (ref 100–199)
HDL: 36 mg/dL — ABNORMAL LOW (ref >39)
LDL CALC: 92 mg/dL (ref 0–99)
Triglycerides: 180 mg/dL — ABNORMAL HIGH (ref 0–149)
VLDL CHOLESTEROL CAL: 36 mg/dL (ref 5–40)

## 2015-09-21 LAB — TESTOSTERONE: TESTOSTERONE: 657 ng/dL (ref 348–1197)

## 2015-09-21 LAB — HEMATOCRIT: Hematocrit: 45.4 % (ref 37.5–51.0)

## 2015-09-21 LAB — PSA: PROSTATE SPECIFIC AG, SERUM: 0.5 ng/mL (ref 0.0–4.0)

## 2015-09-22 ENCOUNTER — Ambulatory Visit (INDEPENDENT_AMBULATORY_CARE_PROVIDER_SITE_OTHER): Payer: BC Managed Care – PPO | Admitting: Urology

## 2015-09-22 ENCOUNTER — Encounter: Payer: Self-pay | Admitting: Urology

## 2015-09-22 VITALS — BP 119/73 | HR 86 | Ht 75.0 in | Wt 323.0 lb

## 2015-09-22 DIAGNOSIS — N401 Enlarged prostate with lower urinary tract symptoms: Secondary | ICD-10-CM | POA: Diagnosis not present

## 2015-09-22 DIAGNOSIS — E291 Testicular hypofunction: Secondary | ICD-10-CM | POA: Diagnosis not present

## 2015-09-22 DIAGNOSIS — N138 Other obstructive and reflux uropathy: Secondary | ICD-10-CM | POA: Insufficient documentation

## 2015-09-22 DIAGNOSIS — N528 Other male erectile dysfunction: Secondary | ICD-10-CM

## 2015-09-22 DIAGNOSIS — N529 Male erectile dysfunction, unspecified: Secondary | ICD-10-CM

## 2015-09-22 MED ORDER — TESTOSTERONE CYPIONATE 200 MG/ML IM SOLN
200.0000 mg | Freq: Once | INTRAMUSCULAR | Status: AC
  Administered 2015-09-22: 200 mg via INTRAMUSCULAR

## 2015-09-22 NOTE — Progress Notes (Signed)
Testosterone IM Injection  Due to Hypogonadism patient is present today for a Testosterone Injection.  Medication: Testosterone Cypionate Dose: 1.5mL Location: left upper outer buttocks Lot: [id number] Exp:10/2016  Patient tolerated well, no complications were noted  Preformed by: Hildy Nicholl, LPN    

## 2015-09-22 NOTE — Progress Notes (Signed)
  09/22/2015 10:10 AM   Rodney Howard 07/17/1966 [id number]  Referring provider: [name] Grandis, MD [address] [place] [address] [place], [place] [address]  Chief Complaint  Patient presents with  . Hypogonadism    6 month follow up    HPI: Patient is a 50 year old Caucasian male with hypogonadism, erectile dysfunction and BPH with LUTS who presents today for a 6 months follow up.    Hypogonadism Patient is experiencing a decrease in libido, a lack of energy and sadness and/or grumpiness.  This is indicated by his responses to the ADAM questionnaire.  He is currently managing his hypogonadism with testosterone cypionate injections.  He receives 1.5 mL every two weeks.  He states he feels that the injection "wears off" 4 to 5 days before his next injection is due.   His most recent testosterone level was 657 ng/dL on 09/20/2015.      Androgen Deficiency in the Aging Male      09/22/15 0800       Androgen Deficiency in the Aging Male   Do you have a decrease in libido (sex drive) Yes     Do you have lack of energy Yes     Do you have a decrease in strength and/or endurance No     Have you lost height No     Have you noticed a decreased "enjoyment of life" No     Are you sad and/or grumpy Yes     Are your erections less strong No     Have you noticed a recent deterioration in your ability to play sports No     Are you falling asleep after dinner No     Has there been a recent deterioration in your work performance No       Erectile dysfunction His SHIM score is 21, which is mild.   He has been having difficulty with erections for the last several years.   His major complaint is maintaining an erections.  His libido is waxing and waning.   His risk factors for ED are obesity, BPH, hypogonadism, HLD, sleep apnea, HTN and psychotropic medications.  He denies any painful erections or curvatures with his erections.   He has tried PDE5-inhibitors in the past with good results.        SHIM       09/22/15 0841       SHIM: Over the last 6 months:   How do you rate your confidence that you could get and keep an erection? Moderate     When you had erections with sexual stimulation, how often were your erections hard enough for penetration (entering your partner)? Almost Always or Always     During sexual intercourse, how often were you able to maintain your erection after you had penetrated (entered) your partner? Not Difficult     During sexual intercourse, how difficult was it to maintain your erection to completion of intercourse? Slightly Difficult     When you attempted sexual intercourse, how often was it satisfactory for you? Slightly Difficult     SHIM Total Score   SHIM 21        Score: 1-7 Severe ED 8-11 Moderate ED 12-16 Mild-Moderate ED 17-21 Mild ED 22-25 No ED   BPH WITH LUTS His IPSS score today is 7, which is mild lower urinary tract symptomatology. He is mostly satisfied with his quality life due to his urinary symptoms. He denies any dysuria, hematuria or suprapubic pain.    He also denies any recent fevers, chills, nausea or vomiting.  He does not have a family history of PCa.      IPSS      09/22/15 0800       International Prostate Symptom Score   How often have you had the sensation of not emptying your bladder? Not at All     How often have you had to urinate less than every two hours? About half the time     How often have you found you stopped and started again several times when you urinated? Not at All     How often have you found it difficult to postpone urination? Not at All     How often have you had a weak urinary stream? Not at All     How often have you had to strain to start urination? Less than half the time     How many times did you typically get up at night to urinate? 2 Times     Total IPSS Score 7     Quality of Life due to urinary symptoms   If you were to spend the rest of your life with your urinary condition just the way it  is now how would you feel about that? Mostly Satisfied        Score:  1-7 Mild 8-19 Moderate 20-35 Severe   PMH: Past Medical History  Diagnosis Date  . Obesity   . GERD (gastroesophageal reflux disease)   . Bipolar 1 disorder (HCC)   . HLD (hyperlipidemia)   . Sleep apnea   . ADD (attention deficit disorder)   . HTN (hypertension)   . Gout   . Osteoarthritis   . DDD (degenerative disc disease), cervical   . BPH (benign prostatic hyperplasia)   . Hypogonadism in male   . Nocturia   . Erectile dysfunction   . Tinea cruris   . Obesity     Surgical History: Past Surgical History  Procedure Laterality Date  . Total knee arthroplasty Left 2014  . Toe surgery    . Wrist surgery    . Lumbar disc surgery      x 3    Home Medications:    Medication List       This list is accurate as of: 09/22/15 10:10 AM.  Always use your most recent med list.               allopurinol 100 MG tablet  Commonly known as:  ZYLOPRIM  TAKE 1 TABLET (100 MG TOTAL) BY MOUTH TWO (2) TIMES A DAY.     allopurinol 300 MG tablet  Commonly known as:  ZYLOPRIM  Reported on 09/22/2015     AMITIZA 24 MCG capsule  Generic drug:  lubiprostone  Take by mouth.     ANDROGEL PUMP 20.25 MG/ACT (1.62%) Gel  Generic drug:  Testosterone  Reported on 09/22/2015     ARIPiprazole 15 MG tablet  Commonly known as:  ABILIFY  Take by mouth. Reported on 09/22/2015     aspirin 81 MG chewable tablet  Chew by mouth.     bumetanide 1 MG tablet  Commonly known as:  BUMEX  Take by mouth.     bumetanide 1 MG tablet  Commonly known as:  BUMEX  TAKE 1 TABLET (1 MG TOTAL) BY MOUTH 3 (THREE) TIMES A DAY.     cephALEXin 500 MG capsule  Commonly known as:  KEFLEX     Cholecalciferol   2000 units Tabs  Take by mouth.     COLACE PO  Take by mouth.     colchicine 0.6 MG tablet  Take 1 mg by mouth 2 (two) times daily. Reported on 09/22/2015     HYDROcodone-acetaminophen 10-325 MG tablet  Commonly known as:   NORCO  Take by mouth. Reported on 09/22/2015     imipramine 50 MG tablet  Commonly known as:  TOFRANIL  Reported on 09/22/2015     ketoconazole 2 % cream  Commonly known as:  NIZORAL  Reported on 09/22/2015     lamoTRIgine 200 MG tablet  Commonly known as:  LAMICTAL  Take 200 mg by mouth 2 (two) times daily. Reported on 09/22/2015     lidocaine 5 % ointment  Commonly known as:  XYLOCAINE  Reported on 09/22/2015     lisinopril 10 MG tablet  Commonly known as:  PRINIVIL,ZESTRIL  Take by mouth. Reported on 09/22/2015     lisinopril 20 MG tablet  Commonly known as:  PRINIVIL,ZESTRIL  Reported on 09/22/2015     lurasidone 20 MG Tabs tablet  Commonly known as:  LATUDA  Take by mouth. Reported on 09/22/2015     metoprolol succinate 100 MG 24 hr tablet  Commonly known as:  TOPROL-XL  Take by mouth.     metoprolol succinate 100 MG 24 hr tablet  Commonly known as:  TOPROL-XL  TAKE 1 TABLET (100 MG TOTAL) BY MOUTH ONCE DAILY.     modafinil 200 MG tablet  Commonly known as:  PROVIGIL  Take by mouth.     nystatin cream  Commonly known as:  MYCOSTATIN  Reported on 09/22/2015     omeprazole 40 MG capsule  Commonly known as:  PRILOSEC  Take by mouth. Reported on 09/22/2015     ondansetron 4 MG disintegrating tablet  Commonly known as:  ZOFRAN-ODT  Reported on 09/22/2015     oxyCODONE 20 mg 12 hr tablet  Commonly known as:  OXYCONTIN  Take by mouth. Reported on 09/22/2015     oxyCODONE-acetaminophen 10-325 MG tablet  Commonly known as:  PERCOCET  TAKE ONE TABLET EVERY 4-6 HOURS AS NEEDED     PENNSAID 2 % Soln  Generic drug:  Diclofenac Sodium  APPLY 2 PUMPS (2 GRAMS) TO AFFECTED AREA OF KNEE(S) TOPICALLY TWO TIMES DAILY AS DIRECTED     predniSONE 10 MG tablet  Commonly known as:  DELTASONE  Reported on 09/22/2015     promethazine 6.25 MG/5ML syrup  Commonly known as:  PHENERGAN  Reported on 09/22/2015     RA NAPROXEN SODIUM 220 MG tablet  Generic drug:  naproxen sodium  Take by mouth.  Reported on 09/22/2015     senna-docusate 8.6-50 MG tablet  Commonly known as:  Senokot-S  Take by mouth.     sildenafil 100 MG tablet  Commonly known as:  VIAGRA  Take 1 tablet (100 mg total) by mouth daily as needed for erectile dysfunction.     sildenafil 20 MG tablet  Commonly known as:  REVATIO  Take 3 to 5 tablets 30 minutes prior to intercourse on an empty stomach     testosterone cypionate 200 MG/ML injection  Commonly known as:  DEPOTESTOSTERONE CYPIONATE  INJECT 1.5 ML EVERY 2 WEEKS AS DIRECTED     tranylcypromine 10 MG tablet  Commonly known as:  PARNATE  Take by mouth 2 (two) times daily.        Allergies:  Allergies  Allergen Reactions  .   Atorvastatin Other (See Comments)    Leg pain and severe headaches  . Demerol [Meperidine]   . Statins     Family History: Family History  Problem Relation Age of Onset  . Arthritis Mother   . Deep vein thrombosis Mother   . Cirrhosis Sister   . Uterine cancer Mother   . Heart disease Father   . Heart disease Mother   . Diabetes Mellitus II Father     Mother, Sister  . Parkinson's disease Father   . Stroke Father   . Kidney disease Neg Hx   . Prostate cancer Neg Hx     Social History:  reports that he has never smoked. He does not have any smokeless tobacco history on file. He reports that he drinks alcohol. He reports that he does not use illicit drugs.  ROS: UROLOGY Frequent Urination?: No Hard to postpone urination?: No Burning/pain with urination?: No Get up at night to urinate?: No Leakage of urine?: No Urine stream starts and stops?: No Trouble starting stream?: No Do you have to strain to urinate?: No Blood in urine?: No Urinary tract infection?: No Sexually transmitted disease?: No Injury to kidneys or bladder?: No Painful intercourse?: No Weak stream?: No Erection problems?: No Penile pain?: No  Gastrointestinal Nausea?: No Vomiting?: No Indigestion/heartburn?: No Diarrhea?:  No Constipation?: No  Constitutional Fever: No Night sweats?: No Weight loss?: No Fatigue?: No  Skin Skin rash/lesions?: No Itching?: No  Eyes Blurred vision?: No Double vision?: No  Ears/Nose/Throat Sore throat?: No Sinus problems?: No  Hematologic/Lymphatic Swollen glands?: No Easy bruising?: No  Cardiovascular Leg swelling?: Yes Chest pain?: No  Respiratory Cough?: No Shortness of breath?: No  Endocrine Excessive thirst?: No  Musculoskeletal Back pain?: No Joint pain?: No  Neurological Headaches?: No Dizziness?: No  Psychologic Depression?: Yes Anxiety?: No  Physical Exam: BP 119/73 mmHg  Pulse 86  Ht 6' 3" (1.905 m)  Wt 323 lb (146.512 kg)  BMI 40.37 kg/m2  Constitutional: Well nourished. Obese. Alert and oriented, No acute distress. HEENT: [place] AT, moist mucus membranes. Trachea midline, no masses. Cardiovascular: No clubbing, cyanosis, or edema. Respiratory: Normal respiratory effort, no increased work of breathing. GI: Abdomen is soft, non tender, non distended, no abdominal masses. Liver and spleen not palpable.  No hernias appreciated.  Stool sample for occult testing is not indicated.   GU: No CVA tenderness.  No bladder fullness or masses.  Patient with circumcised phallus.  Urethral meatus is patent.  No penile discharge. No penile lesions or rashes. Scrotum without lesions, cysts, rashes and/or edema.  Testicles are located scrotally bilaterally. No masses are appreciated in the testicles. Left and right epididymis are normal. Rectal: Patient with  normal sphincter tone. Anus and perineum without scarring or rashes. No rectal masses are appreciated. Prostate is approximately 45 grams, no nodules are appreciated. Seminal vesicles are normal. Skin: No rashes, bruises or suspicious lesions. Lymph: No cervical or inguinal adenopathy. Neurologic: Grossly intact, no focal deficits, moving all 4 extremities. Psychiatric: Normal mood and  affect.  Laboratory Data:  Results for orders placed or performed in visit on 09/20/15  PSA  Result Value Ref Range   Prostate Specific Ag, Serum 0.5 0.0 - 4.0 ng/mL  Testosterone  Result Value Ref Range   Testosterone 657 348 - 1197 ng/dL   Comment, Testosterone Comment   Hematocrit  Result Value Ref Range   Hematocrit 45.4 37.5 - 51.0 %  Lipid panel  Result Value Ref Range     Cholesterol, Total 164 100 - 199 mg/dL   Triglycerides 180 (H) 0 - 149 mg/dL   HDL 36 (L) >39 mg/dL   VLDL Cholesterol Cal 36 5 - 40 mg/dL   LDL Calculated 92 0 - 99 mg/dL   Chol/HDL Ratio 4.6 0.0 - 5.0 ratio units    PSA History     0.5 ng/mL on 12/15/2013  0.6 ng/mL on 06/22/2014  0.5 ng/mL on 09/21/2014     0.6 ng/mL on 03/30/2015     0.5 ng/mL on 09/20/2015  Lab Results  Component Value Date   AST 29 09/28/2013       AST                       23                                                                     02/17/2015  Lab Results  Component Value Date   ALT 28 09/28/2013       ALT                        32                                                                    02/17/2015  ALK PHOS                 35                                                                    02/17/2015 BILIRUBIN, TOTAL   1                                                                      02/17/2015  TSH                              3.53                                                               02/17/2015  Assessment & Plan:    1. Hypogonadism in male:   Patient is doing well with testosterone cypionate, 1.5 mL every two weeks.  We will continue   this dose.  Injection is given today.  He will RTC in 3 months for HCT and testosterone level.    - Estradiol  2. Erectile dysfunction:  SHIM score is 21.  He is doing well with PDE5-inhibitors.  He does not need a refill at this time.  We will continue to monitor.  He will have the SHIM score and exam in 6 months.    3. BPH with LUTS:    IPSS score is 7/2.  We will continue to monitor.  He will have the IPSS score, exam and PSA repeated in 6 months.    Return in about 3 months (around 12/20/2015) for HCT and testosterone blood draw.  These notes generated with voice recognition software. I apologize for typographical errors.  SHANNON MCGOWAN, PA-C  [facility] [address], [address] [place], [place] [address] [phone]   

## 2015-09-23 LAB — ESTRADIOL: Estradiol: 43.3 pg/mL — ABNORMAL HIGH (ref 7.6–42.6)

## 2015-09-27 ENCOUNTER — Encounter: Payer: Self-pay | Admitting: Urology

## 2015-09-27 ENCOUNTER — Telehealth: Payer: Self-pay

## 2015-09-27 NOTE — Telephone Encounter (Signed)
Carollee Herter,        Totally forgot to tell you something at our last appointment that the phlebotomist told me to let you know. I don't think it is that important, but may be in concerns of my cholesterol lab.         I didn't know the morning of the blood draw that I had to be fasting since I thought there was only gonna be a blood draw for testosterone and psa. The good news is that when my lab was drawn, the only thing I had had since midnight was 2 cups of coffee with Equal, but with half and half creamer in both cups. Probably didn't make that big of a difference, but may explain my triglyceride level not being that great?        Anyhow, I know next time to fast before the lab. If you want me to have another blood draw before the one 3 months from now, just let me know and it would be no problem for me to come in.        Thanks!        Lestine Box via [place]

## 2015-09-27 NOTE — Telephone Encounter (Signed)
You do not need to be fasting for lipids.

## 2015-09-28 NOTE — Telephone Encounter (Signed)
Sent information back via mychart. 

## 2015-10-04 ENCOUNTER — Encounter: Payer: Self-pay | Admitting: Urology

## 2015-10-04 ENCOUNTER — Telehealth: Payer: Self-pay

## 2015-10-04 NOTE — Telephone Encounter (Signed)
Rodney Howard,        I saw the results of my estradiol lab this weekend, which was above the high part of the normal range, and I was wondering how this was going to be addressed if at all? I have read MANY different "things" about high levels of estradiol levels and gotta say, I am confused more about it then I was before I read everything.         Rodney Howard via [place]

## 2015-10-04 NOTE — Telephone Encounter (Signed)
I'm confused as well.  I wanting to speak with Dr. Ronne Binning as he has had experience dealing with estradiol.  He should be in the office tomorrow.

## 2015-10-05 ENCOUNTER — Ambulatory Visit: Payer: BC Managed Care – PPO

## 2015-10-05 ENCOUNTER — Encounter: Payer: Self-pay | Admitting: Urology

## 2015-10-05 ENCOUNTER — Other Ambulatory Visit: Payer: Self-pay | Admitting: Urology

## 2015-10-05 ENCOUNTER — Telehealth: Payer: Self-pay

## 2015-10-05 ENCOUNTER — Other Ambulatory Visit: Payer: Self-pay | Admitting: Orthopedic Surgery

## 2015-10-05 DIAGNOSIS — Z96652 Presence of left artificial knee joint: Secondary | ICD-10-CM

## 2015-10-05 MED ORDER — ANASTROZOLE 1 MG PO TABS: 1.0000 mg | ORAL_TABLET | Freq: Every day | ORAL | Status: DC

## 2015-10-05 NOTE — Telephone Encounter (Signed)
Pt called in stating he was supposed to get an injection this morning but didn't come because he had not heard back from you. Pt was wondering what to do. Please advise.

## 2015-10-05 NOTE — Telephone Encounter (Signed)
No. He can continue the testosterone injections.

## 2015-10-05 NOTE — Telephone Encounter (Signed)
Sent information back via mychart. 

## 2015-10-05 NOTE — Telephone Encounter (Signed)
Does pt need to stop testosterone injections?

## 2015-10-05 NOTE — Telephone Encounter (Signed)
-----   Message from Harle Battiest, PA-C sent at 10/05/2015  1:23 PM EST ----- I spoke with Dr. Ronne Binning.  He recommends taking anastrozole 1 mg daily for three months and then retesting his testosterone and estradiol.

## 2015-10-06 ENCOUNTER — Ambulatory Visit (INDEPENDENT_AMBULATORY_CARE_PROVIDER_SITE_OTHER): Payer: BC Managed Care – PPO

## 2015-10-06 DIAGNOSIS — E291 Testicular hypofunction: Secondary | ICD-10-CM | POA: Diagnosis not present

## 2015-10-06 MED ORDER — TESTOSTERONE CYPIONATE 200 MG/ML IM SOLN
200.0000 mg | Freq: Once | INTRAMUSCULAR | Status: AC
  Administered 2015-10-06: 200 mg via INTRAMUSCULAR

## 2015-10-06 NOTE — Progress Notes (Signed)
Testosterone IM Injection  Due to Hypogonadism patient is present today for a Testosterone Injection.  Medication: Testosterone Cypionate Dose: 1.5mL Location: right upper outer buttocks Lot: [id number] Exp:10/2016  Patient tolerated well, no complications were noted  Preformed by: Chelsea Watkins, LPN    

## 2015-10-08 ENCOUNTER — Encounter
Admission: RE | Admit: 2015-10-08 | Discharge: 2015-10-08 | Disposition: A | Discharge status: Home / Self Care | Payer: BC Managed Care – PPO | Source: Ambulatory Visit | Attending: Orthopedic Surgery | Admitting: Orthopedic Surgery

## 2015-10-08 DIAGNOSIS — Z96652 Presence of left artificial knee joint: Secondary | ICD-10-CM | POA: Diagnosis not present

## 2015-10-08 MED ORDER — TECHNETIUM TC 99M SESTAMIBI - CARDIOLITE
24.0280 | Freq: Once | INTRAVENOUS | Status: AC | PRN
  Administered 2015-10-08: 24.028 via INTRAVENOUS

## 2015-10-19 ENCOUNTER — Ambulatory Visit: Payer: BC Managed Care – PPO

## 2015-10-25 DIAGNOSIS — T84038A Mechanical loosening of other internal prosthetic joint, initial encounter: Secondary | ICD-10-CM | POA: Insufficient documentation

## 2015-10-25 DIAGNOSIS — Z96659 Presence of unspecified artificial knee joint: Secondary | ICD-10-CM | POA: Insufficient documentation

## 2015-11-09 DIAGNOSIS — Z01810 Encounter for preprocedural cardiovascular examination: Secondary | ICD-10-CM | POA: Insufficient documentation

## 2015-12-21 ENCOUNTER — Other Ambulatory Visit: Payer: BC Managed Care – PPO

## 2016-01-20 ENCOUNTER — Other Ambulatory Visit: Payer: Self-pay | Admitting: Urology

## 2016-01-27 ENCOUNTER — Other Ambulatory Visit: Payer: Self-pay

## 2016-01-27 DIAGNOSIS — M79605 Pain in left leg: Secondary | ICD-10-CM

## 2016-01-28 ENCOUNTER — Ambulatory Visit
Admission: RE | Admit: 2016-01-28 | Discharge: 2016-01-28 | Disposition: A | Discharge status: Home / Self Care | Payer: BC Managed Care – PPO | Source: Ambulatory Visit

## 2016-01-28 DIAGNOSIS — M79605 Pain in left leg: Secondary | ICD-10-CM | POA: Diagnosis not present

## 2016-02-01 ENCOUNTER — Other Ambulatory Visit: Payer: Self-pay

## 2016-02-01 DIAGNOSIS — I10 Essential (primary) hypertension: Secondary | ICD-10-CM | POA: Insufficient documentation

## 2016-02-01 DIAGNOSIS — M109 Gout, unspecified: Secondary | ICD-10-CM | POA: Insufficient documentation

## 2016-02-01 DIAGNOSIS — M1990 Unspecified osteoarthritis, unspecified site: Secondary | ICD-10-CM | POA: Insufficient documentation

## 2016-02-01 DIAGNOSIS — K219 Gastro-esophageal reflux disease without esophagitis: Secondary | ICD-10-CM | POA: Insufficient documentation

## 2016-02-01 DIAGNOSIS — M9979 Connective tissue and disc stenosis of intervertebral foramina of abdomen and other regions: Secondary | ICD-10-CM | POA: Insufficient documentation

## 2016-02-01 DIAGNOSIS — E7800 Pure hypercholesterolemia, unspecified: Secondary | ICD-10-CM | POA: Insufficient documentation

## 2016-02-01 DIAGNOSIS — E669 Obesity, unspecified: Secondary | ICD-10-CM | POA: Insufficient documentation

## 2016-02-01 DIAGNOSIS — F3130 Bipolar disorder, current episode depressed, mild or moderate severity, unspecified: Secondary | ICD-10-CM | POA: Insufficient documentation

## 2016-02-01 DIAGNOSIS — F988 Other specified behavioral and emotional disorders with onset usually occurring in childhood and adolescence: Secondary | ICD-10-CM | POA: Insufficient documentation

## 2016-02-03 ENCOUNTER — Encounter: Payer: Self-pay | Admitting: Surgery

## 2016-02-03 ENCOUNTER — Other Ambulatory Visit: Payer: Self-pay

## 2016-02-03 ENCOUNTER — Ambulatory Visit (INDEPENDENT_AMBULATORY_CARE_PROVIDER_SITE_OTHER): Payer: BC Managed Care – PPO | Admitting: Surgery

## 2016-02-03 VITALS — BP 133/70 | HR 87 | Temp 96.0°F | Ht 75.0 in | Wt 315.0 lb

## 2016-02-03 DIAGNOSIS — K429 Umbilical hernia without obstruction or gangrene: Secondary | ICD-10-CM | POA: Diagnosis not present

## 2016-02-03 NOTE — Patient Instructions (Signed)
You have requested for your Umbilical Hernia be repaired. Please call after speaking with your wife in regards to a surgery date.  Please see your (blue)pre-care sheet for information.  You will need to arrange to be off work for 1-2 weeks but will have to have a lifting restriction of no more than 15 lbs. For 6 weeks following your surgery.

## 2016-02-03 NOTE — Progress Notes (Signed)
Rodney Howard is an 50 y.o. male.   Chief Complaint: umb pain HPI: Two year history, pain bulge. He had recent symptoms of incarceration with pain lasting 5 days but ultimately it spontaneously reduced and he denies knowledge of any change in the color of his skin at the time of that incarceration. He did not seek medical care at that time. He had some nausea but no emesis. He has had no pain but wished to wish to proceed with surgical repair of an umbilical hernia. Of note he had a gastric sleeve via laparoscopic approach a year ago and lost 45 pounds following that procedure. He's had a recent workup for DVT which was negative.  Past Medical History  Diagnosis Date  . Obesity   . GERD (gastroesophageal reflux disease)   . Bipolar 1 disorder (HCC)   . HLD (hyperlipidemia)   . Sleep apnea   . ADD (attention deficit disorder)   . HTN (hypertension)   . Gout   . Osteoarthritis   . DDD (degenerative disc disease), cervical   . BPH (benign prostatic hyperplasia)   . Hypogonadism in male   . Nocturia   . Erectile dysfunction   . Tinea cruris   . Obesity     Past Surgical History  Procedure Laterality Date  . Total knee arthroplasty Left 2014  . Toe surgery    . Wrist surgery    . Lumbar disc surgery      x 3    Family History  Problem Relation Age of Onset  . Arthritis Mother   . Deep vein thrombosis Mother   . Cirrhosis Sister   . Uterine cancer Mother   . Heart disease Father   . Heart disease Mother   . Diabetes Mellitus II Father     Mother, Sister  . Parkinson's disease Father   . Stroke Father   . Kidney disease Neg Hx   . Prostate cancer Neg Hx    Social History:  reports that he has never smoked. He does not have any smokeless tobacco history on file. He reports that he drinks alcohol. He reports that he does not use illicit drugs.  Allergies:  Allergies  Allergen Reactions  . Atorvastatin Other (See Comments)    Leg pain and severe headaches  .  Demerol [Meperidine]   . Other Swelling    dermabond  . Statins      (Not in a hospital admission)   Review of Systems:   Review of Systems  Constitutional: Negative.   HENT: Negative.   Eyes: Negative.   Respiratory: Negative.   Cardiovascular: Negative.   Gastrointestinal: Positive for heartburn, nausea and abdominal pain. Negative for vomiting and diarrhea.  Genitourinary: Negative.   Musculoskeletal: Negative.   Skin: Negative.   Neurological: Negative.   Endo/Heme/Allergies: Negative.   Psychiatric/Behavioral: Negative.     Physical Exam:  Physical Exam  Constitutional: He is oriented to person, place, and time and well-developed, well-nourished, and in no distress. No distress.  Morbidly obese with a BMI of 40  HENT:  Head: Normocephalic and atraumatic.  Eyes: Pupils are equal, round, and reactive to light. Right eye exhibits no discharge. Left eye exhibits no discharge. No scleral icterus.  Cardiovascular: Normal rate, regular rhythm and normal heart sounds.   Pulmonary/Chest: Effort normal and breath sounds normal. No respiratory distress. He has no wheezes. He has no rales.  Abdominal: Soft. He exhibits no distension. There is no tenderness. There is no rebound and  no guarding.  Umbilical hernia which is reducible but quite large at least 3 cm across.  Musculoskeletal: Normal range of motion. He exhibits edema. He exhibits no tenderness.  Left knee scar with edema  Neurological: He is alert and oriented to person, place, and time.  Skin: Skin is warm and dry. He is not diaphoretic. No erythema.  Psychiatric: Mood and affect normal.  Vitals reviewed.   Blood pressure 133/70, pulse 87, temperature 96 F (35.6 C), temperature source Oral, height 6\' 3"  (1.905 m), weight 315 lb (142.883 kg).    No results found for this or any previous visit (from the past 48 hour(s)). No results found.   Assessment/Plan This a patient with an umbilical hernia with a  history of incarceration but is currently pain-free. I recommended elective repair the rationale for this was discussed the options of observation reviewed the potential and high likelihood of utilizing mesh in this 3 cm at least hernia was discussed with him the risks of mesh placement mesh infection were reviewed as well as cosmetic deformity. We also discussed other risk factors especially his morbid obesity and the risk for DVT PE. We also discussed the need for prophylaxis. He's been on Procrit prophylaxis in the past for his knee surgeries. Her stone agreed to proceed with this plan  [name], MD, FACS

## 2016-02-04 ENCOUNTER — Telehealth: Payer: Self-pay | Admitting: Surgery

## 2016-02-04 NOTE — Telephone Encounter (Signed)
I have called patient to go over surgery information. Not available. Patient will call back to go over information below.   Pt advised of pre op date/time and sx date. Sx: 02/17/16 with Dr Sharene Skeansooper--Umbilical hernia repair with Mesh.  Pre op: 02/07/16 @ 8:15 am--Office.   Patient made aware to call [phone], between 1-3:00 pm the day before surgery, to find out what time to arrive.

## 2016-02-07 ENCOUNTER — Encounter
Admission: RE | Admit: 2016-02-07 | Discharge: 2016-02-07 | Disposition: A | Discharge status: Home / Self Care | Payer: BC Managed Care – PPO | Source: Ambulatory Visit | Attending: Surgery | Admitting: Surgery

## 2016-02-07 DIAGNOSIS — Z01810 Encounter for preprocedural cardiovascular examination: Secondary | ICD-10-CM | POA: Diagnosis present

## 2016-02-07 DIAGNOSIS — Z01812 Encounter for preprocedural laboratory examination: Secondary | ICD-10-CM | POA: Insufficient documentation

## 2016-02-07 HISTORY — DX: Depression, unspecified: F32.A

## 2016-02-07 HISTORY — DX: Major depressive disorder, single episode, unspecified: F32.9

## 2016-02-07 HISTORY — DX: Anxiety disorder, unspecified: F41.9

## 2016-02-07 LAB — BASIC METABOLIC PANEL
ANION GAP: 8 (ref 5–15)
BUN: 16 mg/dL (ref 6–20)
CHLORIDE: 105 mmol/L (ref 101–111)
CO2: 26 mmol/L (ref 22–32)
CREATININE: 1.17 mg/dL (ref 0.61–1.24)
Calcium: 9.5 mg/dL (ref 8.9–10.3)
GFR calc non Af Amer: >60 mL/min (ref >60)
Glucose, Bld: 84 mg/dL (ref 65–99)
POTASSIUM: 4.4 mmol/L (ref 3.5–5.1)
SODIUM: 139 mmol/L (ref 135–145)

## 2016-02-07 LAB — CBC WITH DIFFERENTIAL/PLATELET
Basophils Absolute: 0.1 10*3/uL (ref 0–0.1)
Basophils Relative: 2 %
Eosinophils Absolute: 0.5 10*3/uL (ref 0–0.7)
Eosinophils Relative: 7 %
HEMATOCRIT: 43.2 % (ref 40.0–52.0)
HEMOGLOBIN: 15.3 g/dL (ref 13.0–18.0)
LYMPHS ABS: 2.5 10*3/uL (ref 1.0–3.6)
MCH: 32.9 pg (ref 26.0–34.0)
MCHC: 35.5 g/dL (ref 32.0–36.0)
MCV: 92.5 fL (ref 80.0–100.0)
Monocytes Absolute: 0.8 10*3/uL (ref 0.2–1.0)
NEUTROS ABS: 4.3 10*3/uL (ref 1.4–6.5)
Platelets: 236 10*3/uL (ref 150–440)
RBC: 4.67 MIL/uL (ref 4.40–5.90)
RDW: 13.6 % (ref 11.5–14.5)
WBC: 8.2 10*3/uL (ref 3.8–10.6)

## 2016-02-07 NOTE — Patient Instructions (Signed)
  Your procedure is scheduled on: 02/17/16 Report to Day Surgery. [facility] second floor To find out your arrival time please call [phone] between 1PM - 3PM on 02/16/16  Remember: Instructions that are not followed completely may result in serious medical risk, up to and including death, or upon the discretion of your surgeon and anesthesiologist your surgery may need to be rescheduled.    x____ 1. Do not eat food or drink liquids after midnight. No gum chewing or hard candies.     __x__ 2. No Alcohol for 24 hours before or after surgery.   _x___ 3. Do Not Smoke For 24 Hours Prior to Your Surgery.   ____ 4. Bring all medications with you on the day of surgery if instructed.    x____ 5. Notify your doctor if there is any change in your medical condition     (cold, fever, infections).       Do not wear jewelry, make-up, hairpins, clips or nail polish.  Do not wear lotions, powders, or perfumes. You may wear deodorant.  Do not shave 48 hours prior to surgery. Men may shave face and neck.  Do not bring valuables to the hospital.    [facility] is not responsible for any belongings or valuables.               Contacts, dentures or bridgework may not be worn into surgery.  Leave your suitcase in the car. After surgery it may be brought to your room.  For patients admitted to the hospital, discharge time is determined by your                treatment team.   Patients discharged the day of surgery will not be allowed to drive home.   Please read over the following fact sheets that you were given:   Surgical Site Infection Prevention   _x___ Take these medicines the morning of surgery with A SIP OF WATER:    1.lamotrigine  2.lisinopril   3. percocet  4. parnate  5.  6.  ____ Fleet Enema (as directed)   __x__ Use CHG Soap as directed  ____ Use inhalers on the day of surgery  ____ Stop metformin 2 days prior to surgery    ____ Take 1/2 of usual insulin dose the night  before surgery and none on the morning of surgery.   ____ Stop Coumadin/Plavix/aspirin on ____ Stop Anti-inflammatories on    ____ Stop supplements until after surgery.    __x__ Bring C-Pap to the hospital.

## 2016-02-17 ENCOUNTER — Encounter: Payer: Self-pay | Admitting: *Deleted

## 2016-02-17 ENCOUNTER — Encounter
Admission: RE | Disposition: A | Discharge status: Home / Self Care | Payer: Self-pay | Source: Ambulatory Visit | Attending: Surgery

## 2016-02-17 ENCOUNTER — Inpatient Hospital Stay: Payer: BC Managed Care – PPO | Admitting: Anesthesiology

## 2016-02-17 ENCOUNTER — Ambulatory Visit
Admission: RE | Admit: 2016-02-17 | Discharge: 2016-02-17 | Disposition: A | Discharge status: Home / Self Care | Payer: BC Managed Care – PPO | Source: Ambulatory Visit | Attending: Surgery | Admitting: Surgery

## 2016-02-17 DIAGNOSIS — Z8249 Family history of ischemic heart disease and other diseases of the circulatory system: Secondary | ICD-10-CM | POA: Diagnosis not present

## 2016-02-17 DIAGNOSIS — Z79899 Other long term (current) drug therapy: Secondary | ICD-10-CM | POA: Insufficient documentation

## 2016-02-17 DIAGNOSIS — K429 Umbilical hernia without obstruction or gangrene: Secondary | ICD-10-CM | POA: Diagnosis not present

## 2016-02-17 DIAGNOSIS — M1990 Unspecified osteoarthritis, unspecified site: Secondary | ICD-10-CM | POA: Insufficient documentation

## 2016-02-17 DIAGNOSIS — Z823 Family history of stroke: Secondary | ICD-10-CM | POA: Diagnosis not present

## 2016-02-17 DIAGNOSIS — Z8379 Family history of other diseases of the digestive system: Secondary | ICD-10-CM | POA: Insufficient documentation

## 2016-02-17 DIAGNOSIS — Z96652 Presence of left artificial knee joint: Secondary | ICD-10-CM | POA: Diagnosis not present

## 2016-02-17 DIAGNOSIS — I10 Essential (primary) hypertension: Secondary | ICD-10-CM | POA: Insufficient documentation

## 2016-02-17 DIAGNOSIS — Z833 Family history of diabetes mellitus: Secondary | ICD-10-CM | POA: Diagnosis not present

## 2016-02-17 DIAGNOSIS — Z7982 Long term (current) use of aspirin: Secondary | ICD-10-CM | POA: Diagnosis not present

## 2016-02-17 DIAGNOSIS — Z6841 Body Mass Index (BMI) 40.0 and over, adult: Secondary | ICD-10-CM | POA: Diagnosis not present

## 2016-02-17 DIAGNOSIS — G4730 Sleep apnea, unspecified: Secondary | ICD-10-CM | POA: Diagnosis not present

## 2016-02-17 DIAGNOSIS — Z9889 Other specified postprocedural states: Secondary | ICD-10-CM | POA: Diagnosis not present

## 2016-02-17 DIAGNOSIS — F419 Anxiety disorder, unspecified: Secondary | ICD-10-CM | POA: Insufficient documentation

## 2016-02-17 DIAGNOSIS — F319 Bipolar disorder, unspecified: Secondary | ICD-10-CM | POA: Insufficient documentation

## 2016-02-17 DIAGNOSIS — M109 Gout, unspecified: Secondary | ICD-10-CM | POA: Insufficient documentation

## 2016-02-17 DIAGNOSIS — N400 Enlarged prostate without lower urinary tract symptoms: Secondary | ICD-10-CM | POA: Diagnosis not present

## 2016-02-17 DIAGNOSIS — Z8049 Family history of malignant neoplasm of other genital organs: Secondary | ICD-10-CM | POA: Insufficient documentation

## 2016-02-17 DIAGNOSIS — E785 Hyperlipidemia, unspecified: Secondary | ICD-10-CM | POA: Diagnosis not present

## 2016-02-17 DIAGNOSIS — M5030 Other cervical disc degeneration, unspecified cervical region: Secondary | ICD-10-CM | POA: Insufficient documentation

## 2016-02-17 DIAGNOSIS — N529 Male erectile dysfunction, unspecified: Secondary | ICD-10-CM | POA: Diagnosis not present

## 2016-02-17 DIAGNOSIS — Z9884 Bariatric surgery status: Secondary | ICD-10-CM | POA: Insufficient documentation

## 2016-02-17 DIAGNOSIS — Z820 Family history of epilepsy and other diseases of the nervous system: Secondary | ICD-10-CM | POA: Insufficient documentation

## 2016-02-17 DIAGNOSIS — K219 Gastro-esophageal reflux disease without esophagitis: Secondary | ICD-10-CM | POA: Diagnosis not present

## 2016-02-17 DIAGNOSIS — Z8261 Family history of arthritis: Secondary | ICD-10-CM | POA: Insufficient documentation

## 2016-02-17 HISTORY — PX: UMBILICAL HERNIA REPAIR: SHX196

## 2016-02-17 SURGERY — REPAIR, HERNIA, UMBILICAL, ADULT
Anesthesia: General | Wound class: Clean

## 2016-02-17 MED ORDER — CHLORHEXIDINE GLUCONATE CLOTH 2 % EX PADS: 6.0000 | MEDICATED_PAD | Freq: Once | CUTANEOUS | Status: DC

## 2016-02-17 MED ORDER — FAMOTIDINE 20 MG PO TABS
ORAL_TABLET | ORAL | Status: AC
  Filled 2016-02-17: qty 1

## 2016-02-17 MED ORDER — SUGAMMADEX SODIUM 200 MG/2ML IV SOLN
INTRAVENOUS | Status: DC | PRN
  Administered 2016-02-17: 285.8 mg via INTRAVENOUS

## 2016-02-17 MED ORDER — MIDAZOLAM HCL 2 MG/2ML IJ SOLN
INTRAMUSCULAR | Status: DC | PRN
  Administered 2016-02-17: 2 mg via INTRAVENOUS

## 2016-02-17 MED ORDER — OXYCODONE HCL 5 MG/5ML PO SOLN: 5.0000 mg | Freq: Once | ORAL | Status: AC | PRN

## 2016-02-17 MED ORDER — FENTANYL CITRATE (PF) 100 MCG/2ML IJ SOLN
INTRAMUSCULAR | Status: AC
  Administered 2016-02-17: 50 ug via INTRAVENOUS
  Filled 2016-02-17: qty 2

## 2016-02-17 MED ORDER — PROPOFOL 10 MG/ML IV BOLUS
INTRAVENOUS | Status: DC | PRN
  Administered 2016-02-17: 180 mg via INTRAVENOUS

## 2016-02-17 MED ORDER — HEPARIN SODIUM (PORCINE) 5000 UNIT/ML IJ SOLN
5000.0000 [IU] | Freq: Once | INTRAMUSCULAR | Status: AC
  Administered 2016-02-17: 5000 [IU] via SUBCUTANEOUS

## 2016-02-17 MED ORDER — OXYCODONE HCL 5 MG PO TABS
ORAL_TABLET | ORAL | Status: AC
  Filled 2016-02-17: qty 1

## 2016-02-17 MED ORDER — OXYCODONE HCL 5 MG PO TABS
5.0000 mg | ORAL_TABLET | Freq: Once | ORAL | Status: AC | PRN
  Administered 2016-02-17: 5 mg via ORAL

## 2016-02-17 MED ORDER — HEPARIN SODIUM (PORCINE) 5000 UNIT/ML IJ SOLN
INTRAMUSCULAR | Status: AC
  Filled 2016-02-17: qty 1

## 2016-02-17 MED ORDER — ACETAMINOPHEN 10 MG/ML IV SOLN
INTRAVENOUS | Status: DC | PRN
  Administered 2016-02-17: 1000 mg via INTRAVENOUS

## 2016-02-17 MED ORDER — LACTATED RINGERS IV SOLN
INTRAVENOUS | Status: DC
  Administered 2016-02-17 (×2): via INTRAVENOUS

## 2016-02-17 MED ORDER — FENTANYL CITRATE (PF) 100 MCG/2ML IJ SOLN
25.0000 ug | INTRAMUSCULAR | Status: DC | PRN
  Administered 2016-02-17 (×3): 50 ug via INTRAVENOUS

## 2016-02-17 MED ORDER — BUPIVACAINE-EPINEPHRINE (PF) 0.25% -1:200000 IJ SOLN
INTRAMUSCULAR | Status: AC
  Filled 2016-02-17: qty 30

## 2016-02-17 MED ORDER — FAMOTIDINE 20 MG PO TABS
20.0000 mg | ORAL_TABLET | Freq: Once | ORAL | Status: AC
  Administered 2016-02-17: 20 mg via ORAL

## 2016-02-17 MED ORDER — OXYCODONE-ACETAMINOPHEN 5-325 MG PO TABS
1.0000 | ORAL_TABLET | ORAL | Status: DC | PRN
  Administered 2016-02-17: 1 via ORAL

## 2016-02-17 MED ORDER — CEFAZOLIN SODIUM 10 G IJ SOLR
3.0000 g | INTRAMUSCULAR | Status: AC
  Administered 2016-02-17: 3 g via INTRAVENOUS
  Filled 2016-02-17: qty 3000

## 2016-02-17 MED ORDER — BUPIVACAINE-EPINEPHRINE 0.25% -1:200000 IJ SOLN
INTRAMUSCULAR | Status: DC | PRN
  Administered 2016-02-17: 30 mL

## 2016-02-17 MED ORDER — KETOROLAC TROMETHAMINE 30 MG/ML IJ SOLN
INTRAMUSCULAR | Status: DC | PRN
  Administered 2016-02-17: 30 mg via INTRAVENOUS

## 2016-02-17 MED ORDER — LIDOCAINE HCL (CARDIAC) 20 MG/ML IV SOLN
INTRAVENOUS | Status: DC | PRN
Start: 2016-02-17 — End: 2016-02-17
  Administered 2016-02-17: 40 mg via INTRAVENOUS

## 2016-02-17 MED ORDER — ROCURONIUM BROMIDE 100 MG/10ML IV SOLN
INTRAVENOUS | Status: DC | PRN
Start: 2016-02-17 — End: 2016-02-17
  Administered 2016-02-17: 30 mg via INTRAVENOUS

## 2016-02-17 MED ORDER — OXYCODONE-ACETAMINOPHEN 5-325 MG PO TABS
ORAL_TABLET | ORAL | Status: AC
  Filled 2016-02-17: qty 1

## 2016-02-17 MED ORDER — ACETAMINOPHEN 10 MG/ML IV SOLN
INTRAVENOUS | Status: AC
  Filled 2016-02-17: qty 100

## 2016-02-17 MED ORDER — EPHEDRINE SULFATE 50 MG/ML IJ SOLN
INTRAMUSCULAR | Status: DC | PRN
  Administered 2016-02-17: 10 mg via INTRAVENOUS
  Administered 2016-02-17: 5 mg via INTRAVENOUS

## 2016-02-17 MED ORDER — SUCCINYLCHOLINE CHLORIDE 20 MG/ML IJ SOLN
INTRAMUSCULAR | Status: DC | PRN
  Administered 2016-02-17: 140 mg via INTRAVENOUS

## 2016-02-17 MED ORDER — FENTANYL CITRATE (PF) 100 MCG/2ML IJ SOLN
INTRAMUSCULAR | Status: DC | PRN
  Administered 2016-02-17: 100 ug via INTRAVENOUS

## 2016-02-17 SURGICAL SUPPLY — 31 items
ADHESIVE MASTISOL STRL (MISCELLANEOUS) ×3 IMPLANT
CANISTER SUCT 1200ML W/VALVE (MISCELLANEOUS) ×3 IMPLANT
CHLORAPREP W/TINT 26ML (MISCELLANEOUS) ×3 IMPLANT
CLOSURE WOUND 1/2 X4 (GAUZE/BANDAGES/DRESSINGS) ×1
DRAPE LAPAROTOMY 100X77 ABD (DRAPES) ×3 IMPLANT
ELECT REM PT RETURN 9FT ADLT (ELECTROSURGICAL) ×3
ELECTRODE REM PT RTRN 9FT ADLT (ELECTROSURGICAL) ×1 IMPLANT
GAUZE SPONGE NON-WVN 2X2 STRL (MISCELLANEOUS) IMPLANT
GLOVE BIO SURGEON STRL SZ8 (GLOVE) ×6 IMPLANT
GLOVE EXAM NITRILE PF MED BLUE (GLOVE) ×6 IMPLANT
GLOVE PROTEXIS LATEX SZ 7.5 (GLOVE) ×6 IMPLANT
GOWN STRL REUS W/ TWL LRG LVL3 (GOWN DISPOSABLE) ×3 IMPLANT
GOWN STRL REUS W/TWL LRG LVL3 (GOWN DISPOSABLE) ×6
LABEL OR SOLS (LABEL) ×3 IMPLANT
MESH VENTRALEX ST 2.5 CRC MED (Mesh General) ×3 IMPLANT
NDL SAFETY 22GX1.5 (NEEDLE) ×3 IMPLANT
NS IRRIG 500ML POUR BTL (IV SOLUTION) ×3 IMPLANT
PACK BASIN MINOR ARMC (MISCELLANEOUS) ×3 IMPLANT
SPONGE LAP 18X18 5 PK (GAUZE/BANDAGES/DRESSINGS) ×3 IMPLANT
SPONGE VERSALON 2X2 STRL (MISCELLANEOUS)
STRIP CLOSURE SKIN 1/2X4 (GAUZE/BANDAGES/DRESSINGS) ×2 IMPLANT
SUT ETHIBOND 0 (SUTURE) IMPLANT
SUT ETHIBOND NAB CT1 #1 30IN (SUTURE) IMPLANT
SUT MNCRL 4-0 (SUTURE) ×2
SUT MNCRL 4-0 27XMFL (SUTURE) ×1
SUT PROLENE 0 CT 1 30 (SUTURE) ×9 IMPLANT
SUT PROLENE 1 CT 1 30 (SUTURE) IMPLANT
SUT VIC AB 3-0 SH 27 (SUTURE) ×2
SUT VIC AB 3-0 SH 27X BRD (SUTURE) ×1 IMPLANT
SUTURE MNCRL 4-0 27XMF (SUTURE) ×1 IMPLANT
SYRINGE 10CC LL (SYRINGE) ×3 IMPLANT

## 2016-02-17 NOTE — Anesthesia Preprocedure Evaluation (Signed)
Anesthesia Evaluation  Patient identified by MRN, date of birth, ID band Patient awake    Reviewed: Allergy & Precautions, H&P , NPO status , Patient's Chart, lab work & pertinent test results  History of Anesthesia Complications Negative for: history of anesthetic complications  Airway Mallampati: III  TM Distance: >3 FB Neck ROM: full    Dental  (+) Poor Dentition   Pulmonary neg shortness of breath, sleep apnea and Continuous Positive Airway Pressure Ventilation ,    Pulmonary exam normal breath sounds clear to auscultation       Cardiovascular Exercise Tolerance: Good hypertension, (-) angina+ CAD (patient not aware of this)  (-) Past MI and (-) DOE Normal cardiovascular exam Rhythm:regular Rate:Normal     Neuro/Psych PSYCHIATRIC DISORDERS Anxiety Depression Bipolar Disorder negative neurological ROS     GI/Hepatic Neg liver ROS, GERD  Controlled,  Endo/Other  negative endocrine ROS  Renal/GU negative Renal ROS  negative genitourinary   Musculoskeletal  (+) Arthritis ,   Abdominal   Peds  Hematology negative hematology ROS (+)   Anesthesia Other Findings Past Medical History:   Obesity                                                      GERD (gastroesophageal reflux disease)                       Bipolar 1 disorder (HCC)                                     HLD (hyperlipidemia)                                         Sleep apnea                                                  ADD (attention deficit disorder)                             HTN (hypertension)                                           Gout                                                         Osteoarthritis                                               DDD (degenerative disc disease), cervical  BPH (benign prostatic hyperplasia)                           Hypogonadism in male                                         Nocturia                                                      Erectile dysfunction                                         Tinea cruris                                                 Obesity                                                      Depression                                                   Anxiety                                                     Past Surgical History:   TOTAL KNEE ARTHROPLASTY                         Left 2014         TOE SURGERY                                                   WRIST SURGERY                                                 LUMBAR DISC SURGERY                                             Comment:x 3   STOMACH SURGERY  Comment:gastric sleeve   JOINT REPLACEMENT                                               Comment:x 3   BACK SURGERY                                                  CARDIAC CATHETERIZATION                                         Reproductive/Obstetrics negative OB ROS                             Anesthesia Physical Anesthesia Plan  ASA: III  Anesthesia Plan: General ETT   Post-op Pain Management:    Induction:   Airway Management Planned:   Additional Equipment:   Intra-op Plan:   Post-operative Plan:   Informed Consent: I have reviewed the patients History and Physical, chart, labs and discussed the procedure including the risks, benefits and alternatives for the proposed anesthesia with the patient or authorized representative who has indicated his/her understanding and acceptance.   Dental Advisory Given  Plan Discussed with: Anesthesiologist, CRNA and Surgeon  Anesthesia Plan Comments:         Anesthesia Quick Evaluation

## 2016-02-17 NOTE — Progress Notes (Signed)
She is met in the preop holding area with his wife. We discussed the rationale for surgery. He was examined again. I discussed with him the rationale the options of observation and the need for mesh placement in this patient because of the rather large hernia. I also discussed and emphasized the risk of infection of the mesh due to his obesity. He is a nonsmoker. I discussed with him the risks of bleeding infection mesh placement mesh infection bowel injury DVT and PE also his allergy to Dermabond. We will use Steri-Strips as I usually do. We also discussed cosmetic says he wants to have a "inny". Multiple questions were answered for he and his wife they understood and agreed with this plan and are requesting repair with mesh.

## 2016-02-17 NOTE — Op Note (Signed)
Abdominal Hernia Repair  Pre-operative Diagnosis: Umbilical hernia  Post-operative Diagnosis: Same  Surgeon: Adah Salvageichard E. Excell Seltzerooper, MD FACS  Anesthesia: Gen. with endotracheal tube  Assistant: Surgical tech  Procedure Details  The patient was seen again in the Holding Room. The benefits, complications, treatment options, and expected outcomes were discussed with the patient. The risks of bleeding, infection, recurrence of symptoms, failure to resolve symptoms, bowel injury, mesh placement, mesh infection, any of which could require further surgery were reviewed with the patient. The likelihood of improving the patient's symptoms with return to their baseline status is good.  The patient and/or family concurred with the proposed plan, giving informed consent.  The patient was taken to Operating Room, identified as Rodney Howard and the procedure verified.  A Time Out was held and the above information confirmed.  Prior to the induction of general anesthesia, antibiotic prophylaxis was administered. VTE prophylaxis was in place. General endotracheal anesthesia was then administered and tolerated well. After the induction, the abdomen was prepped with Chloraprep and draped in the sterile fashion. The patient was positioned in the supine position.  A curvilinear incision was made in the infraumbilical area after placing local anesthetic. Skin of the umbilicus was elevated off of the hernia sac and the hernia sac was resected with electrocautery hemostasis was with electrocautery. The hernia sac and some preperitoneal fat was sent off for examination. The rent was measured at approximately 3 cm and a 6.4 cm ventral X patch was placed into the abdominal cavity and held in with figure-of-eight 0 Prolenes. The fascia was able to be closed over the mesh with figure-of-eight Prolenes as well. This completed an adequate repair. Additional Marcaine was placed for a total of 30 cc hemostasis was adequate and  sponge lap needle count was correct therefore the wound was closed by tacking the skin of the umbilicus back to the fascia with figure-of-eight 30 Vicryls and then deep sutures of 3-0 Vicryl were placed in layers followed by 4-0 subcuticular Monocryl Steri-Strips Mastisol and sterile dressings were placed    Findings:  3 cm umbilical hernia  Estimated Blood Loss: Normal         Drains: None         Specimens: Umbilical hernia sac and preperitoneal fat        Complications: none               [name] E. Excell Seltzerooper, MD, FACS

## 2016-02-17 NOTE — Discharge Instructions (Signed)
Remove dressing in 24 hours. °May shower in 24 hours. °Leave paper strips in place. °Resume all home medications. °Follow-up with Dr. Cooper in 10 days. ° °AMBULATORY SURGERY  °DISCHARGE INSTRUCTIONS ° ° °1) The drugs that you were given will stay in your system until tomorrow so for the next 24 hours you should not: ° °A) Drive an automobile °B) Make any legal decisions °C) Drink any alcoholic beverage ° ° °2) You may resume regular meals tomorrow.  Today it is better to start with liquids and gradually work up to solid foods. ° °You may eat anything you prefer, but it is better to start with liquids, then soup and crackers, and gradually work up to solid foods. ° ° °3) Please notify your doctor immediately if you have any unusual bleeding, trouble breathing, redness and pain at the surgery site, drainage, fever, or pain not relieved by medication. ° ° ° °4) Additional Instructions: ° ° ° ° ° ° ° °Please contact your physician with any problems or Same Day Surgery at [phone], Monday through Friday 6 am to 4 pm, or [place] [facility] [place] [facility] number at [phone]. °

## 2016-02-17 NOTE — Anesthesia Procedure Notes (Signed)
Procedure Name: Intubation Date/Time: 02/17/2016 7:28 AM Performed by: Henrietta HooverPOPE, Beryl Balz Pre-anesthesia Checklist: Patient identified, Emergency Drugs available, Suction available, Patient being monitored and Timeout performed Patient Re-evaluated:Patient Re-evaluated prior to inductionOxygen Delivery Method: Circle system utilized Preoxygenation: Pre-oxygenation with 100% oxygen Intubation Type: IV induction Ventilation: Oral airway inserted - appropriate to patient size Laryngoscope Size: Mac and 4 Tube type: Oral Tube size: 7.5 mm Number of attempts: 1 Airway Equipment and Method: Stylet Placement Confirmation: ETT inserted through vocal cords under direct vision,  positive ETCO2 and breath sounds checked- equal and bilateral Secured at: 24 cm Tube secured with: Tape Dental Injury: Teeth and Oropharynx as per pre-operative assessment

## 2016-02-17 NOTE — Transfer of Care (Signed)
Immediate Anesthesia Transfer of Care Note  Patient: Rodney Howard  Procedure(s) Performed: Procedure(s): HERNIA REPAIR UMBILICAL ADULT (N/A)  Patient Location: PACU  Anesthesia Type:General  Level of Consciousness: sedated  Airway & Oxygen Therapy: Patient Spontanous Breathing and Patient connected to face mask oxygen  Post-op Assessment: Report given to RN and Post -op Vital signs reviewed and stable  Post vital signs: Reviewed and stable  Last Vitals:  Filed Vitals:   02/17/16 0614 02/17/16 0810  BP: 132/72 123/77  Pulse: 74 90  Temp: 36.7 C 36.5 C  Resp: 18 22    Last Pain:  Filed Vitals:   02/17/16 0811  PainSc: 5          Complications: No apparent anesthesia complications

## 2016-02-17 NOTE — Anesthesia Postprocedure Evaluation (Signed)
Anesthesia Post Note  Patient: Rodney Howard  Procedure(s) Performed: Procedure(s) (LRB): HERNIA REPAIR UMBILICAL ADULT (N/A)  Patient location during evaluation: PACU Anesthesia Type: General Level of consciousness: awake and alert Pain management: pain level controlled Vital Signs Assessment: post-procedure vital signs reviewed and stable Respiratory status: spontaneous breathing, nonlabored ventilation, respiratory function stable and patient connected to nasal cannula oxygen Cardiovascular status: blood pressure returned to baseline and stable Postop Assessment: no signs of nausea or vomiting Anesthetic complications: no    Last Vitals:  Filed Vitals:   02/17/16 0913 02/17/16 0953  BP: 127/74 135/82  Pulse: 81 86  Temp: 36.4 C   Resp: 16 16    Last Pain:  Filed Vitals:   02/17/16 0954  PainSc: 3                  Cleda MccreedyJoseph K Davis Ambrosini

## 2016-02-18 ENCOUNTER — Telehealth: Payer: Self-pay

## 2016-02-18 MED ORDER — OXYCODONE-ACETAMINOPHEN 10-325 MG PO TABS: 1.0000 | ORAL_TABLET | ORAL | Status: DC | PRN

## 2016-02-18 NOTE — Telephone Encounter (Signed)
Returned phone call.   Spoke with Dr. Excell Seltzerooper.

## 2016-02-18 NOTE — Telephone Encounter (Signed)
Pt called stating Dr. Excell Seltzerooper did an Umbilical hernia repair on 02/17/16. During the visit, Dr. Excell Seltzerooper asked pt if he needed pain medication after surgery because pt is already taking Percocet 10/325mg  for other pain issues. He stated he had told Dr. Excell Seltzerooper he didn't need anything because he already had that medication. He stated he is currently taking up to 5 pills daily now. He called saying he was having break through pain today and depending on how he sits or lays, the pain level can reach 7 to 8. I had mentioned to him that he can alternate this with Ibuprofen as Dr. Excell Seltzerooper may not add an additional medication because he already takes 10/325mg . Pt wanted me to send you a message to see what Dr. Excell Seltzerooper recommends as he had discussed this with him and offered a rx for pain meds. He says Dr. Excell Seltzerooper is fully aware of what he takes.

## 2016-02-18 NOTE — Telephone Encounter (Signed)
Patient's wife came to office with ID and picked up prescription.

## 2016-02-20 LAB — SURGICAL PATHOLOGY

## 2016-02-25 ENCOUNTER — Other Ambulatory Visit: Payer: Self-pay

## 2016-02-28 ENCOUNTER — Ambulatory Visit: Payer: BC Managed Care – PPO | Admitting: General Surgery

## 2016-02-29 ENCOUNTER — Ambulatory Visit (INDEPENDENT_AMBULATORY_CARE_PROVIDER_SITE_OTHER): Payer: BC Managed Care – PPO | Admitting: General Surgery

## 2016-02-29 ENCOUNTER — Encounter: Payer: Self-pay | Admitting: General Surgery

## 2016-02-29 VITALS — BP 139/77 | HR 77 | Temp 96.3°F | Ht 75.0 in | Wt 314.0 lb

## 2016-02-29 DIAGNOSIS — Z4889 Encounter for other specified surgical aftercare: Secondary | ICD-10-CM

## 2016-02-29 NOTE — Progress Notes (Signed)
Outpatient Surgical Follow Up  02/29/2016  Rodney Howard is an 50 y.o. male.   Chief Complaint  Patient presents with  . Routine Post Op    Umbilical Hernia Repair (02/17/16)- Dr. Excell Seltzer    HPI: 50 year old male returns to clinic for follow-up 2 weeks status post open umbilical hernia repair. Patient reports doing well. Denies any pain. He is tolerating a diet without any nausea, vomiting, chest pain, shortness of breath, diarrhea, constipation. He's been very happy with his surgical experience this far.  Past Medical History  Diagnosis Date  . Obesity   . GERD (gastroesophageal reflux disease)   . Bipolar 1 disorder (HCC)   . HLD (hyperlipidemia)   . Sleep apnea   . ADD (attention deficit disorder)   . HTN (hypertension)   . Gout   . Osteoarthritis   . DDD (degenerative disc disease), cervical   . BPH (benign prostatic hyperplasia)   . Hypogonadism in male   . Nocturia   . Erectile dysfunction   . Tinea cruris   . Obesity   . Depression   . Anxiety     Past Surgical History  Procedure Laterality Date  . Total knee arthroplasty Left 2014  . Toe surgery    . Wrist surgery    . Lumbar disc surgery      x 3  . Stomach surgery      gastric sleeve  . Joint replacement      x 3  . Back surgery    . Cardiac catheterization    . Umbilical hernia repair N/A 02/17/2016    Procedure: HERNIA REPAIR UMBILICAL ADULT;  Surgeon: Lattie Haw, MD;  Location: [facility] ORS;  Service: General;  Laterality: N/A;    Family History  Problem Relation Age of Onset  . Arthritis Mother   . Deep vein thrombosis Mother   . Cirrhosis Sister   . Uterine cancer Mother   . Heart disease Father   . Heart disease Mother   . Diabetes Mellitus II Father     Mother, Sister  . Parkinson's disease Father   . Stroke Father   . Kidney disease Neg Hx   . Prostate cancer Neg Hx     Social History:  reports that he has never smoked. He has never used smokeless tobacco. He reports that he drinks  alcohol. He reports that he does not use illicit drugs.  Allergies:  Allergies  Allergen Reactions  . Atorvastatin Other (See Comments)    Leg pain and severe headaches  . Demerol [Meperidine]     Interacts with other medication, causes blood pressure to raise  . Statins Other (See Comments)    Headaches/migraines  . Other Swelling and Rash    dermabond    Medications reviewed.    ROS A multipoint review of systems was completed, all pertinent positives and negatives are documented within the history of present illness and remainder are negative.   BP 139/77 mmHg  Pulse 77  Temp(Src) 96.3 F (35.7 C) (Oral)  Ht  (1.905 m)  Wt 142.429 kg (314 lb)  BMI 39.25 kg/m2  Physical Exam Gen.: No acute distress Chest: Clear to auscultation Heart: Regular rate and rhythm Abdomen: Soft, nontender, nondistended. Well approximated umbilical hernia incision site without any evidence of erythema or drainage.    No results found for this or any previous visit (from the past 48 hour(s)). No results found.  Assessment/Plan:  1. Aftercare following surgery 50 year old male 2 weeks  status post open umbilical hernia repair. Provided with standard postoperative hernia instructions to include lifting restrictions and precautions for infection. He voiced understanding and will follow-up in clinic on an as-needed basis.     Ricarda Frameharles Laylee Schooley, MD FACS General Surgeon  02/29/2016,9:14 AM

## 2016-02-29 NOTE — Patient Instructions (Signed)

## 2016-03-01 DIAGNOSIS — G629 Polyneuropathy, unspecified: Secondary | ICD-10-CM | POA: Insufficient documentation

## 2016-03-13 ENCOUNTER — Telehealth: Payer: Self-pay | Admitting: General Surgery

## 2016-03-13 NOTE — Telephone Encounter (Signed)
Patient stated that he will be having some dental work next month and needs a note faxed over to Dr. Malen Gauze on [facility] stating that it is ok to have this done after surgery. Fax [phone]

## 2016-03-13 NOTE — Telephone Encounter (Signed)
LVM for patient to call office regarding Dental surgery.

## 2016-03-13 NOTE — Telephone Encounter (Signed)
Patient returned call. If you can just fax over a letter stating it is okay for patient to have dental surgery. [facility] Fax # [phone]  Patient had Umbilical hernia with Dr Excell Seltzer on 02/03/16, that is why he needs a letter sent to his dentist.

## 2016-03-14 NOTE — Telephone Encounter (Signed)
Left message stating letter had been sent to Dr.Blankenship.

## 2016-07-04 DIAGNOSIS — M4316 Spondylolisthesis, lumbar region: Secondary | ICD-10-CM | POA: Insufficient documentation

## 2016-07-04 DIAGNOSIS — M961 Postlaminectomy syndrome, not elsewhere classified: Secondary | ICD-10-CM | POA: Insufficient documentation

## 2016-08-17 ENCOUNTER — Encounter: Payer: Self-pay | Admitting: Surgery

## 2017-04-25 ENCOUNTER — Other Ambulatory Visit: Payer: Self-pay

## 2017-04-25 ENCOUNTER — Telehealth: Payer: Self-pay | Admitting: Gastroenterology

## 2017-04-25 DIAGNOSIS — Z86010 Personal history of colonic polyps: Secondary | ICD-10-CM

## 2017-04-25 MED ORDER — CEFAZOLIN (ANCEF) 1 G IV SOLR: 1.0000 g | INTRAVENOUS | Status: AC

## 2017-04-25 NOTE — Telephone Encounter (Signed)
Patient called to schedule his colonoscopy. He has another surgery to work around so would like a call ASAP

## 2017-04-25 NOTE — Telephone Encounter (Signed)
Gastroenterology Pre-Procedure Review  Request Date: 11/12 Requesting Physician: Dr. Servando SnareWohl  PATIENT REVIEW QUESTIONS: The patient responded to the following health history questions as indicated:    1. Are you having any GI issues? no 2. Do you have a personal history of Polyps? yes (removed) 3. Do you have a family history of Colon Cancer or Polyps? no 4. Diabetes Mellitus? no 5. Joint replacements in the past 12 months?yes (knee replacement in Oct) 6. Major health problems in the past 3 months?no 7. Any artificial heart valves, MVP, or defibrillator?yes (spinal stimulator)    MEDICATIONS & ALLERGIES:    Patient reports the following regarding taking any anticoagulation/antiplatelet therapy:   Plavix, Coumadin, Eliquis, Xarelto, Lovenox, Pradaxa, Brilinta, or Effient? no Aspirin? yes (81mg )  Patient confirms/reports the following medications:  Current Outpatient Prescriptions  Medication Sig Dispense Refill  . anastrozole (ARIMIDEX) 1 MG tablet Take 1 tablet (1 mg total) by mouth daily. 30 tablet 3  . ARIPiprazole (ABILIFY) 15 MG tablet Take 15 mg by mouth at bedtime. Reported on 09/22/2015    . aspirin 81 MG chewable tablet Chew 81 mg by mouth daily.     . bumetanide (BUMEX) 1 MG tablet Take 2 mg by mouth daily as needed (diuretic).     . cephALEXin (KEFLEX) 500 MG capsule Take 500 mg by mouth daily.   0  . Cholecalciferol 2000 UNITS TABS Take 2,000 Units by mouth daily. Reported on 02/03/2016    . colchicine 0.6 MG tablet Take 1 mg by mouth as needed (gout flare up). Reported on 09/22/2015  3  . Docusate Sodium (COLACE PO) Take 100 mg by mouth 2 (two) times daily.     Marland Kitchen. lamoTRIgine (LAMICTAL) 200 MG tablet Take 200 mg by mouth 2 (two) times daily. Reported on 09/22/2015    . lisinopril (PRINIVIL,ZESTRIL) 10 MG tablet Take by mouth. Reported on 09/22/2015    . metoprolol succinate (TOPROL-XL) 100 MG 24 hr tablet Take 100 mg by mouth at bedtime.     . modafinil (PROVIGIL) 200 MG tablet Take  200 mg by mouth 2 (two) times daily.     Marland Kitchen. oxyCODONE-acetaminophen (PERCOCET) 10-325 MG tablet Take 1 tablet by mouth every 4 (four) hours as needed for pain. 20 tablet 0  . senna-docusate (SENOKOT-S) 8.6-50 MG tablet Take 1 tablet by mouth at bedtime.     . sildenafil (REVATIO) 20 MG tablet Take 3 to 5 tablets 30 minutes prior to intercourse on an empty stomach 90 tablet 3  . testosterone cypionate (DEPOTESTOSTERONE CYPIONATE) 200 MG/ML injection INJECT 1.5 ML EVERY 2 WEEKS AS DIRECTED 10 mL 0  . tranylcypromine (PARNATE) 10 MG tablet Take 30 mg by mouth 3 (three) times daily. Reported on 02/03/2016     No current facility-administered medications for this visit.     Patient confirms/reports the following allergies:  Allergies  Allergen Reactions  . Atorvastatin Other (See Comments)    Leg pain and severe headaches  . Demerol [Meperidine]     Interacts with other medication, causes blood pressure to raise  . Statins Other (See Comments)    Headaches/migraines  . Other Swelling and Rash    dermabond    No orders of the defined types were placed in this encounter.   AUTHORIZATION INFORMATION Primary Insurance: 1D#: Group #:  Secondary Insurance: 1D#: Group #:  SCHEDULE INFORMATION: Date: 11/12 Time: Location: [facility]

## 2017-06-11 ENCOUNTER — Encounter: Payer: Self-pay | Admitting: *Deleted

## 2017-06-12 NOTE — Anesthesia Preprocedure Evaluation (Addendum)
Anesthesia Evaluation  Patient identified by MRN, date of birth, ID band  Reviewed: Allergy & Precautions, NPO status , Patient's Chart, lab work & pertinent test results, reviewed documented beta blocker date and time   Airway Mallampati: II  TM Distance: >3 FB Neck ROM: Full    Dental no notable dental hx.    Pulmonary sleep apnea ,    Pulmonary exam normal breath sounds clear to auscultation       Cardiovascular hypertension, (-) CAD (Negative stress test and echo) Normal cardiovascular exam Rhythm:Regular Rate:Normal     Neuro/Psych PSYCHIATRIC DISORDERS Anxiety Depression Bipolar Disorder Chronic pain, nerve stimulatorPatient is on a MOA Inhibitor and has continued that medication preoperatively    GI/Hepatic Neg liver ROS, GERD  ,  Endo/Other  Morbid obesity  Renal/GU negative Renal ROS  negative genitourinary   Musculoskeletal  (+) Arthritis ,   Abdominal (+) + obese,   Peds  Hematology   Anesthesia Other Findings   Reproductive/Obstetrics negative OB ROS                           Anesthesia Physical Anesthesia Plan  ASA: III  Anesthesia Plan: General   Post-op Pain Management:    Induction: Intravenous  PONV Risk Score and Plan:   Airway Management Planned: Natural Airway  Additional Equipment: None  Intra-op Plan:   Post-operative Plan:   Informed Consent: I have reviewed the patients History and Physical, chart, labs and discussed the procedure including the risks, benefits and alternatives for the proposed anesthesia with the patient or authorized representative who has indicated his/her understanding and acceptance.     Plan Discussed with: CRNA, Anesthesiologist and Surgeon  Anesthesia Plan Comments:         Anesthesia Quick Evaluation

## 2017-06-22 NOTE — Discharge Instructions (Signed)
General Anesthesia, Adult, Care After °These instructions provide you with information about caring for yourself after your procedure. Your health care provider may also give you more specific instructions. Your treatment has been planned according to current medical practices, but problems sometimes occur. Call your health care provider if you have any problems or questions after your procedure. °What can I expect after the procedure? °After the procedure, it is common to have: °· Vomiting. °· A sore throat. °· Mental slowness. ° °It is common to feel: °· Nauseous. °· Cold or shivery. °· Sleepy. °· Tired. °· Sore or achy, even in parts of your body where you did not have surgery. ° °Follow these instructions at home: °For at least 24 hours after the procedure: °· Do not: °? Participate in activities where you could fall or become injured. °? Drive. °? Use heavy machinery. °? Drink alcohol. °? Take sleeping pills or medicines that cause drowsiness. °? Make important decisions or sign legal documents. °? Take care of children on your own. °· Rest. °Eating and drinking °· If you vomit, drink water, juice, or soup when you can drink without vomiting. °· Drink enough fluid to keep your urine clear or pale yellow. °· Make sure you have little or no nausea before eating solid foods. °· Follow the diet recommended by your health care provider. °General instructions °· Have a responsible adult stay with you until you are awake and alert. °· Return to your normal activities as told by your health care provider. Ask your health care provider what activities are safe for you. °· Take over-the-counter and prescription medicines only as told by your health care provider. °· If you smoke, do not smoke without supervision. °· Keep all follow-up visits as told by your health care provider. This is important. °Contact a health care provider if: °· You continue to have nausea or vomiting at home, and medicines are not helpful. °· You  cannot drink fluids or start eating again. °· You cannot urinate after 8-12 hours. °· You develop a skin rash. °· You have fever. °· You have increasing redness at the site of your procedure. °Get help right away if: °· You have difficulty breathing. °· You have chest pain. °· You have unexpected bleeding. °· You feel that you are having a life-threatening or urgent problem. °This information is not intended to replace advice given to you by your health care provider. Make sure you discuss any questions you have with your health care provider. °Document Released: 11/06/2000 Document Revised: 01/03/2016 Document Reviewed: 07/15/2015 °Elsevier Interactive Patient Education © 2018 [employer] ° °

## 2017-06-25 ENCOUNTER — Ambulatory Visit
Admission: RE | Admit: 2017-06-25 | Discharge: 2017-06-25 | Disposition: A | Discharge status: Home / Self Care | Payer: BC Managed Care – PPO | Source: Ambulatory Visit | Attending: Gastroenterology | Admitting: Gastroenterology

## 2017-06-25 ENCOUNTER — Ambulatory Visit: Payer: BC Managed Care – PPO | Admitting: Anesthesiology

## 2017-06-25 ENCOUNTER — Encounter
Admission: RE | Disposition: A | Discharge status: Home / Self Care | Payer: Self-pay | Source: Ambulatory Visit | Attending: Gastroenterology

## 2017-06-25 DIAGNOSIS — K5730 Diverticulosis of large intestine without perforation or abscess without bleeding: Secondary | ICD-10-CM | POA: Insufficient documentation

## 2017-06-25 DIAGNOSIS — K635 Polyp of colon: Secondary | ICD-10-CM | POA: Insufficient documentation

## 2017-06-25 DIAGNOSIS — Z7982 Long term (current) use of aspirin: Secondary | ICD-10-CM | POA: Insufficient documentation

## 2017-06-25 DIAGNOSIS — E785 Hyperlipidemia, unspecified: Secondary | ICD-10-CM | POA: Diagnosis not present

## 2017-06-25 DIAGNOSIS — G4730 Sleep apnea, unspecified: Secondary | ICD-10-CM | POA: Diagnosis not present

## 2017-06-25 DIAGNOSIS — M109 Gout, unspecified: Secondary | ICD-10-CM | POA: Diagnosis not present

## 2017-06-25 DIAGNOSIS — N400 Enlarged prostate without lower urinary tract symptoms: Secondary | ICD-10-CM | POA: Insufficient documentation

## 2017-06-25 DIAGNOSIS — F319 Bipolar disorder, unspecified: Secondary | ICD-10-CM | POA: Diagnosis not present

## 2017-06-25 DIAGNOSIS — Z79899 Other long term (current) drug therapy: Secondary | ICD-10-CM | POA: Diagnosis not present

## 2017-06-25 DIAGNOSIS — F419 Anxiety disorder, unspecified: Secondary | ICD-10-CM | POA: Diagnosis not present

## 2017-06-25 DIAGNOSIS — Z6841 Body Mass Index (BMI) 40.0 and over, adult: Secondary | ICD-10-CM | POA: Diagnosis not present

## 2017-06-25 DIAGNOSIS — G629 Polyneuropathy, unspecified: Secondary | ICD-10-CM | POA: Diagnosis not present

## 2017-06-25 DIAGNOSIS — Z860100 Personal history of colon polyps, unspecified: Secondary | ICD-10-CM

## 2017-06-25 DIAGNOSIS — K621 Rectal polyp: Secondary | ICD-10-CM | POA: Diagnosis not present

## 2017-06-25 DIAGNOSIS — K219 Gastro-esophageal reflux disease without esophagitis: Secondary | ICD-10-CM | POA: Insufficient documentation

## 2017-06-25 DIAGNOSIS — Z1211 Encounter for screening for malignant neoplasm of colon: Secondary | ICD-10-CM | POA: Insufficient documentation

## 2017-06-25 DIAGNOSIS — I10 Essential (primary) hypertension: Secondary | ICD-10-CM | POA: Diagnosis not present

## 2017-06-25 HISTORY — PX: COLONOSCOPY WITH PROPOFOL: SHX5780

## 2017-06-25 HISTORY — DX: Lymphedema, not elsewhere classified: I89.0

## 2017-06-25 HISTORY — PX: POLYPECTOMY: SHX5525

## 2017-06-25 HISTORY — DX: Other specified postprocedural states: Z98.890

## 2017-06-25 HISTORY — DX: Presence of other specified functional implants: Z96.89

## 2017-06-25 HISTORY — DX: Polyneuropathy, unspecified: G62.9

## 2017-06-25 SURGERY — COLONOSCOPY WITH PROPOFOL
Anesthesia: General | Site: Rectum | Wound class: Contaminated

## 2017-06-25 MED ORDER — PROPOFOL 10 MG/ML IV BOLUS
INTRAVENOUS | Status: DC | PRN
  Administered 2017-06-25: 40 mg via INTRAVENOUS
  Administered 2017-06-25: 50 mg via INTRAVENOUS
  Administered 2017-06-25 (×3): 40 mg via INTRAVENOUS
  Administered 2017-06-25: 20 mg via INTRAVENOUS
  Administered 2017-06-25: 40 mg via INTRAVENOUS
  Administered 2017-06-25: 150 mg via INTRAVENOUS
  Administered 2017-06-25: 20 mg via INTRAVENOUS

## 2017-06-25 MED ORDER — LACTATED RINGERS IV SOLN
INTRAVENOUS | Status: DC
  Administered 2017-06-25: 07:00:00 via INTRAVENOUS

## 2017-06-25 MED ORDER — LIDOCAINE HCL (CARDIAC) 20 MG/ML IV SOLN
INTRAVENOUS | Status: DC | PRN
  Administered 2017-06-25: 50 mg via INTRAVENOUS

## 2017-06-25 MED ORDER — SIMETHICONE 40 MG/0.6ML PO SUSP
ORAL | Status: DC | PRN
  Administered 2017-06-25: 5 mL

## 2017-06-25 SURGICAL SUPPLY — 23 items

## 2017-06-25 NOTE — Anesthesia Procedure Notes (Signed)
Date/Time: 06/25/2017 7:58 AM Performed by: Maree KrabbeWarren, Dezmen Alcock, CRNA Pre-anesthesia Checklist: Patient identified, Emergency Drugs available, Suction available, Timeout performed and Patient being monitored Patient Re-evaluated:Patient Re-evaluated prior to induction Oxygen Delivery Method: Nasal cannula Placement Confirmation: positive ETCO2

## 2017-06-25 NOTE — Op Note (Signed)
[name] Gastroenterology Patient Name: Rodney Howard Procedure Date: 06/25/2017 7:40 AM MRN: [id number] Account #: [id number] Date of Birth: [birth year] Admit Type: Outpatient Age: 51 [facility] Gender: Male Note Status: Finalized Procedure:            Colonoscopy Indications:          High risk colon cancer surveillance: Personal history                        of colonic polyps Providers:            Midge Miniumarren Danny Yackley MD, MD Referring MD:         Letitia CaulHeidi M. Grandis MD, MD (Referring MD) Medicines:            Propofol per Anesthesia Complications:        No immediate complications. Procedure:            Pre-Anesthesia Assessment:                       - Prior to the procedure, a History and Physical was                        performed, and patient medications and allergies were                        reviewed. The patient's tolerance of previous                        anesthesia was also reviewed. The risks and benefits of                        the procedure and the sedation options and risks were                        discussed with the patient. All questions were                        answered, and informed consent was obtained. Prior                        Anticoagulants: The patient has taken no previous                        anticoagulant or antiplatelet agents. ASA Grade                        Assessment: II - A patient with mild systemic disease.                        After reviewing the risks and benefits, the patient was                        deemed in satisfactory condition to undergo the                        procedure.                       After obtaining informed consent, the colonoscope was  passed under direct vision. Throughout the procedure,                        the patient's blood pressure, pulse, and oxygen                        saturations were monitored continuously. The Olympus CF     H180AL Colonoscope (S#: [id number]) was introduced through                        the anus and advanced to the the cecum, identified by                        appendiceal orifice and ileocecal valve. The                        colonoscopy was performed without difficulty. The                        patient tolerated the procedure well. The quality of                        the bowel preparation was excellent. Findings:      The perianal and digital rectal examinations were normal.      A 3 mm polyp was found in the rectum. The polyp was sessile. The polyp       was removed with a cold biopsy forceps. Resection and retrieval were       complete.      Multiple small-mouthed diverticula were found in the sigmoid colon. Impression:           - One 3 mm polyp in the rectum, removed with a cold                        biopsy forceps. Resected and retrieved.                       - Diverticulosis in the sigmoid colon. Recommendation:       - Discharge patient to home.                       - Resume previous diet.                       - Continue present medications.                       - Repeat colonoscopy in 5 years for surveillance. Procedure Code(s):    --- Professional ---                       [phone], Colonoscopy, flexible; with biopsy, single or                        multiple Diagnosis Code(s):    --- Professional ---                       Z86.010, Personal history of colonic polyps                       K62.1, Rectal polyp CPT copyright 2016 American Medical  Association. All rights reserved. The codes documented in this report are preliminary and upon coder review may  be revised to meet current compliance requirements. Midge Miniumarren Bernd Crom MD, MD 06/25/2017 8:21:14 AM This report has been signed electronically. Number of Addenda: 0 Note Initiated On: 06/25/2017 7:40 AM Scope Withdrawal Time: 0 hours 10 minutes 16 seconds  Total Procedure Duration: 0 hours 15 minutes 53 seconds        [name]

## 2017-06-25 NOTE — Anesthesia Postprocedure Evaluation (Signed)
Anesthesia Post Note  Patient: Rodney Howard  Procedure(s) Performed: COLONOSCOPY WITH PROPOFOL (N/A Rectum) POLYPECTOMY (N/A Rectum)  Patient location during evaluation: PACU Anesthesia Type: General Level of consciousness: awake Pain management: pain level controlled Vital Signs Assessment: post-procedure vital signs reviewed and stable Respiratory status: spontaneous breathing Cardiovascular status: blood pressure returned to baseline Postop Assessment: no headache Anesthetic complications: no    Beckey DowningEric Yarrow Linhart

## 2017-06-25 NOTE — H&P (Signed)
Midge Miniumarren Kamdyn Colborn, MD [facility] [address] [place], [place] [address] Phone:[phone] Fax : [phone]  Primary Care Physician:  Rolm GalaGrandis, [name], MD Primary Gastroenterologist:  Dr. Servando SnareWohl  Pre-Procedure History & Physical: HPI:  Rodney Ridgelerry D Horvath is a 51 y.o. male is here for an colonoscopy.   Past Medical History:  Diagnosis Date  . ADD (attention deficit disorder)   . Anxiety   . Bipolar 1 disorder (HCC)   . BPH (benign prostatic hyperplasia)   . DDD (degenerative disc disease), cervical   . Depression   . Erectile dysfunction   . GERD (gastroesophageal reflux disease)   . Gout   . HLD (hyperlipidemia)   . HTN (hypertension)   . Hypogonadism in male   . Lymphedema   . Nocturia   . Obesity   . Obesity   . Osteoarthritis   . Peripheral neuropathy    feet  . S/P insertion of spinal cord stimulator   . Sleep apnea    CPAP  . Tinea cruris     Past Surgical History:  Procedure Laterality Date  . BACK SURGERY    . CARDIAC CATHETERIZATION    . JOINT REPLACEMENT     x 3  . LUMBAR DISC SURGERY     x 3  . STOMACH SURGERY     gastric sleeve  . TOE SURGERY    . TOTAL KNEE ARTHROPLASTY Left 2014  . WRIST SURGERY      Prior to Admission medications   Medication Sig Start Date End Date Taking? Authorizing Provider  anastrozole (ARIMIDEX) 1 MG tablet Take 1 tablet (1 mg total) by mouth daily. 10/05/15  Yes McGowan, Carollee HerterShannon A, PA-C  ARIPiprazole (ABILIFY) 15 MG tablet Take 15 mg by mouth at bedtime. Reported on 09/22/2015 07/21/13  Yes [provider]  aspirin 81 MG chewable tablet Chew 81 mg by mouth daily.    Yes [provider]  bumetanide (BUMEX) 1 MG tablet Take 2 mg by mouth daily as needed (diuretic).  02/23/14 06/11/22 Yes [provider]  CALCIUM PO Take 1,500 mg by mouth daily.   Yes [provider]  Cholecalciferol 2000 UNITS TABS Take 2,000 Units by mouth daily. Reported on 02/03/2016   Yes [provider]    clonazePAM (KLONOPIN) 1 MG tablet Take 1 mg by mouth as needed for anxiety.   Yes [provider]  colchicine 0.6 MG tablet Take 1 mg by mouth as needed (gout flare up). Reported on 09/22/2015 07/23/14  Yes [provider]  Docusate Sodium (COLACE PO) Take 100 mg by mouth 2 (two) times daily.    Yes [provider]  lamoTRIgine (LAMICTAL) 200 MG tablet Take 200 mg by mouth 2 (two) times daily. Reported on 09/22/2015   Yes [provider]  lisinopril (PRINIVIL,ZESTRIL) 10 MG tablet Take by mouth. Reported on 09/22/2015   Yes [provider]  metoprolol succinate (TOPROL-XL) 100 MG 24 hr tablet Take 100 mg by mouth at bedtime.  10/08/13 06/11/22 Yes [provider]  modafinil (PROVIGIL) 200 MG tablet Take 200 mg by mouth 2 (two) times daily.    Yes [provider]  Multiple Vitamin (MULTIVITAMIN) tablet Take 1 tablet by mouth daily.   Yes [provider]  oxyCODONE (OXYCONTIN) 20 mg 12 hr tablet Take 20 mg by mouth daily. 8 PM   Yes [provider]  oxyCODONE (OXYCONTIN) 30 MG 12 hr tablet Take 30 mg by mouth 2 (two) times daily. 4 AM  and 12 Noon   Yes [provider]  oxyCODONE-acetaminophen (PERCOCET) 10-325 MG tablet Take 1 tablet by mouth every 4 (four) hours as needed for pain. 02/18/16  Yes Ricarda FrameWoodham, Charles, MD  pantoprazole (PROTONIX) 40 MG tablet Take 40 mg by mouth daily.   Yes [provider]  rosuvastatin (CRESTOR) 10 MG tablet Take 10 mg by mouth daily.   Yes [provider]  senna-docusate (SENOKOT-S) 8.6-50 MG tablet Take 1 tablet by mouth at bedtime.    Yes [provider]  sildenafil (REVATIO) 20 MG tablet Take 3 to 5 tablets 30 minutes prior to intercourse on an empty stomach 05/17/15  Yes McGowan, Carollee HerterShannon A, PA-C  testosterone cypionate (DEPOTESTOSTERONE CYPIONATE) 200 MG/ML injection INJECT 1.5 ML EVERY 2 WEEKS AS DIRECTED 08/20/15  Yes McGowan, Carollee HerterShannon A, PA-C  tranylcypromine  (PARNATE) 10 MG tablet Take 30 mg by mouth 3 (three) times daily. Reported on 02/03/2016   Yes [provider]  cephALEXin (KEFLEX) 500 MG capsule Take 500 mg by mouth daily.  08/20/14   [provider]    Allergies as of 04/25/2017 - Review Complete 02/29/2016  Allergen Reaction Noted  . Atorvastatin Other (See Comments) 03/24/2015  . Demerol [meperidine]  09/18/2014  . Statins Other (See Comments) 09/18/2014  . Other Swelling and Rash 02/01/2016    Family History  Problem Relation Age of Onset  . Arthritis Mother   . Deep vein thrombosis Mother   . Uterine cancer Mother   . Heart disease Mother   . Cirrhosis Sister   . Heart disease Father   . Diabetes Mellitus II Father        Mother, Sister  . Parkinson's disease Father   . Stroke Father   . Kidney disease Neg Hx   . Prostate cancer Neg Hx     Social History   Socioeconomic History  . Marital status: Married    Spouse name: Not on file  . Number of children: Not on file  . Years of education: Not on file  . Highest education level: Not on file  Social Needs  . Financial resource strain: Not on file  . Food insecurity - worry: Not on file  . Food insecurity - inability: Not on file  . Transportation needs - medical: Not on file  . Transportation needs - non-medical: Not on file  Occupational History  . Not on file  Tobacco Use  . Smoking status: Never Smoker  . Smokeless tobacco: Never Used  Substance and Sexual Activity  . Alcohol use: Yes    Alcohol/week: 1.2 oz    Types: 2 Cans of beer per week    Comment: occasional  . Drug use: No  . Sexual activity: Not on file  Other Topics Concern  . Not on file  Social History Narrative  . Not on file    Review of Systems: See HPI, otherwise negative ROS  Physical Exam: BP 112/62   Pulse 71   Temp 97.9 F (36.6 C) (Tympanic)   Resp 16   Ht 6\' 3"  (1.905 m)   Wt (!) 326 lb (147.9 kg)   SpO2 100%   BMI 40.75 kg/m  General:   Alert,   pleasant and cooperative in NAD Head:  Normocephalic and atraumatic. Neck:  Supple; no masses or thyromegaly. Lungs:  Clear throughout to auscultation.    Heart:  Regular rate and rhythm. Abdomen:  Soft, nontender and nondistended. Normal bowel sounds, without guarding, and without rebound.  Neurologic:  Alert and  oriented x4;  grossly normal neurologically.  Impression/Plan: Rodney Howard is here for an colonoscopy to be performed for history of colon poylps  Risks, benefits, limitations, and alternatives regarding  colonoscopy have been reviewed with the patient.  Questions have been answered.  All parties agreeable.   Midge Minium, MD  06/25/2017, 7:35 AM

## 2017-06-25 NOTE — Transfer of Care (Signed)
Immediate Anesthesia Transfer of Care Note  Patient: Rodney Howard  Procedure(s) Performed: COLONOSCOPY WITH PROPOFOL (N/A Rectum) POLYPECTOMY (N/A Rectum)  Patient Location: PACU  Anesthesia Type: General  Level of Consciousness: awake, alert  and patient cooperative  Airway and Oxygen Therapy: Patient Spontanous Breathing and Patient connected to supplemental oxygen  Post-op Assessment: Post-op Vital signs reviewed, Patient's Cardiovascular Status Stable, Respiratory Function Stable, Patent Airway and No signs of Nausea or vomiting  Post-op Vital Signs: Reviewed and stable  Complications: No apparent anesthesia complications

## 2017-06-26 ENCOUNTER — Encounter: Payer: Self-pay | Admitting: Gastroenterology

## 2017-06-27 ENCOUNTER — Encounter: Payer: Self-pay | Admitting: Gastroenterology

## 2017-06-28 ENCOUNTER — Encounter: Payer: Self-pay | Admitting: Gastroenterology

## 2017-07-26 ENCOUNTER — Encounter: Payer: Self-pay | Admitting: Urology

## 2017-07-26 ENCOUNTER — Ambulatory Visit: Payer: BC Managed Care – PPO | Admitting: Urology

## 2017-07-26 VITALS — BP 122/80 | HR 91 | Ht 75.0 in | Wt 330.0 lb

## 2017-07-26 DIAGNOSIS — E291 Testicular hypofunction: Secondary | ICD-10-CM | POA: Diagnosis not present

## 2017-07-26 NOTE — Progress Notes (Signed)
07/26/2017 2:42 PM   Rodney Howard [birth year] [id number]  Referring provider: Rolm Gala, MD [address] [place], [place] [address]  Chief Complaint  Patient presents with  . Follow-up    hypogonadism    HPI: 51 year old male presents for follow-up.  I last saw him at [facility] on 05/08/2016.  He is currently injecting 100 mg of testosterone cypionate every Monday.  He is also taking anastrozole 1 mg daily.  He is a chronic pain patient states 6 weeks ago his opiate dose was doubled and he has noted worsening libido, fatigue and erectile dysfunction.  He had labs drawn at [facility] in September 2018 and his PSA was stable at 0.6 by, testosterone level was 379 and hematocrit was 49.  He denies dysuria or gross hematuria.  He denies flank, abdominal, pelvic or scrotal pain.  He denies breast tenderness/enlargement or lower extremity edema.  There is  PMH: Past Medical History:  Diagnosis Date  . ADD (attention deficit disorder)   . Anxiety   . Bipolar 1 disorder (HCC)   . BPH (benign prostatic hyperplasia)   . DDD (degenerative disc disease), cervical   . Depression   . Erectile dysfunction   . GERD (gastroesophageal reflux disease)   . Gout   . HLD (hyperlipidemia)   . HTN (hypertension)   . Hypogonadism in male   . Lymphedema   . Nocturia   . Obesity   . Obesity   . Osteoarthritis   . Peripheral neuropathy    feet  . S/P insertion of spinal cord stimulator   . Sleep apnea    CPAP  . Tinea cruris     Surgical History: Past Surgical History:  Procedure Laterality Date  . BACK SURGERY    . CARDIAC CATHETERIZATION    . COLONOSCOPY WITH PROPOFOL N/A 06/25/2017   Procedure: COLONOSCOPY WITH PROPOFOL;  Surgeon: Midge Minium, MD;  Location: [facility];  Service: Endoscopy;  Laterality: N/A;  sleep apnea  . JOINT REPLACEMENT     x 3  . LUMBAR DISC SURGERY     x 3  . POLYPECTOMY N/A 06/25/2017   Procedure: POLYPECTOMY;  Surgeon: Midge Minium, MD;  Location:  [facility];  Service: Endoscopy;  Laterality: N/A;  . STOMACH SURGERY     gastric sleeve  . TOE SURGERY    . TOTAL KNEE ARTHROPLASTY Left 2014  . UMBILICAL HERNIA REPAIR N/A 02/17/2016   Procedure: HERNIA REPAIR UMBILICAL ADULT;  Surgeon: Lattie Haw, MD;  Location: [facility];  Service: General;  Laterality: N/A;  . WRIST SURGERY      Home Medications:  Allergies as of 07/26/2017      Reactions   Atorvastatin Other (See Comments)   Leg pain and severe headaches   Demerol [meperidine]    Interacts with other medication, causes blood pressure to raise   Statins Other (See Comments)   Headaches/migraines   Other Swelling, Rash   dermabond      Medication List        Accurate as of 07/26/17  2:42 PM. Always use your most recent med list.          anastrozole 1 MG tablet Commonly known as:  ARIMIDEX Take 1 tablet (1 mg total) by mouth daily.   ARIPiprazole 15 MG tablet Commonly known as:  ABILIFY Take 15 mg by mouth at bedtime. Reported on 09/22/2015   aspirin 81 MG chewable tablet Chew 81 mg by mouth daily.   bumetanide  1 MG tablet Commonly known as:  BUMEX Take 2 mg by mouth daily as needed (diuretic).   CALCIUM PO Take 1,500 mg by mouth daily.   Cholecalciferol 2000 units Tabs Take 2,000 Units by mouth daily. Reported on 02/03/2016   COLACE PO Take 100 mg by mouth 2 (two) times daily.   colchicine 0.6 MG tablet Take 1 mg by mouth as needed (gout flare up). Reported on 09/22/2015   KLONOPIN 1 MG tablet Generic drug:  clonazePAM Take 1 mg by mouth as needed for anxiety.   lamoTRIgine 200 MG tablet Commonly known as:  LAMICTAL Take 200 mg by mouth 2 (two) times daily. Reported on 09/22/2015   lisinopril 10 MG tablet Commonly known as:  PRINIVIL,ZESTRIL Take by mouth. Reported on 09/22/2015   metoprolol succinate 100 MG 24 hr tablet Commonly known as:  TOPROL-XL Take 100 mg by mouth at bedtime.   modafinil 200 MG tablet Commonly known as:   PROVIGIL Take 200 mg by mouth 2 (two) times daily.   multivitamin tablet Take 1 tablet by mouth daily.   oxyCODONE-acetaminophen 10-325 MG tablet Commonly known as:  PERCOCET Take 1 tablet by mouth every 4 (four) hours as needed for pain.   OXYCONTIN 30 MG 12 hr tablet Generic drug:  oxyCODONE Take 30 mg by mouth 2 (two) times daily. 4 AM and 12 Noon   OXYCONTIN 20 mg 12 hr tablet Generic drug:  oxyCODONE Take 20 mg by mouth daily. 8 PM   pantoprazole 40 MG tablet Commonly known as:  PROTONIX Take 40 mg by mouth daily.   rosuvastatin 10 MG tablet Commonly known as:  CRESTOR Take 10 mg by mouth daily.   senna-docusate 8.6-50 MG tablet Commonly known as:  Senokot-S Take 1 tablet by mouth at bedtime.   testosterone cypionate 200 MG/ML injection Commonly known as:  DEPOTESTOSTERONE CYPIONATE INJECT 1.5 ML EVERY 2 WEEKS AS DIRECTED   tranylcypromine 10 MG tablet Commonly known as:  PARNATE Take 30 mg by mouth 3 (three) times daily. Reported on 02/03/2016   VIAGRA 100 MG tablet Generic drug:  sildenafil Take by mouth.       Allergies:  Allergies  Allergen Reactions  . Atorvastatin Other (See Comments)    Leg pain and severe headaches  . Demerol [Meperidine]     Interacts with other medication, causes blood pressure to raise  . Statins Other (See Comments)    Headaches/migraines  . Other Swelling and Rash    dermabond    Family History: Family History  Problem Relation Age of Onset  . Arthritis Mother   . Deep vein thrombosis Mother   . Uterine cancer Mother   . Heart disease Mother   . Cirrhosis Sister   . Heart disease Father   . Diabetes Mellitus II Father        Mother, Sister  . Parkinson's disease Father   . Stroke Father   . Kidney disease Neg Hx   . Prostate cancer Neg Hx     Social History:  reports that  has never smoked. he has never used smokeless tobacco. He reports that he drinks about 1.2 oz of alcohol per week. He reports that he does  not use drugs.  ROS: UROLOGY Frequent Urination?: No Hard to postpone urination?: No Burning/pain with urination?: No Get up at night to urinate?: No Leakage of urine?: No Urine stream starts and stops?: No Trouble starting stream?: No Do you have to strain to urinate?: No Blood in  urine?: No Urinary tract infection?: No Sexually transmitted disease?: No Injury to kidneys or bladder?: No Painful intercourse?: No Weak stream?: No Erection problems?: No Penile pain?: No  Gastrointestinal Nausea?: No Vomiting?: No Indigestion/heartburn?: No Diarrhea?: No Constipation?: No  Constitutional Fever: No Night sweats?: No Weight loss?: No Fatigue?: No  Skin Skin rash/lesions?: No Itching?: No  Eyes Blurred vision?: No Double vision?: No  Ears/Nose/Throat Sore throat?: No Sinus problems?: No  Hematologic/Lymphatic Swollen glands?: No Easy bruising?: No  Cardiovascular Leg swelling?: Yes Chest pain?: No  Respiratory Cough?: No Shortness of breath?: No  Endocrine Excessive thirst?: No  Musculoskeletal Back pain?: Yes Joint pain?: Yes  Neurological Headaches?: No Dizziness?: No  Psychologic Depression?: Yes Anxiety?: No  Physical Exam: BP 122/80   Pulse 91   Ht 6\' 3"  (1.905 m)   Wt (!) 330 lb (149.7 kg)   BMI 41.25 kg/m   Constitutional:  Alert and oriented, No acute distress. HEENT: [place] AT, moist mucus membranes.  Trachea midline, no masses. Cardiovascular: No clubbing, cyanosis, or edema. Respiratory: Normal respiratory effort, no increased work of breathing. GI: Abdomen is soft, nontender, nondistended, no abdominal masses GU: No CVA tenderness.  Skin: No rashes, bruises or suspicious lesions. Lymph: No cervical or inguinal adenopathy. Neurologic: Grossly intact, no focal deficits, moving all 4 extremities. Psychiatric: Normal mood and affect.  Laboratory Data: Lab Results  Component Value Date   WBC 8.2 02/07/2016   HGB 15.3  02/07/2016   HCT 43.2 02/07/2016   MCV 92.5 02/07/2016   PLT 236 02/07/2016    Lab Results  Component Value Date   CREATININE 1.17 02/07/2016    Lab Results  Component Value Date   PSA1 0.5 09/20/2015   PSA1 0.6 03/30/2015    Lab Results  Component Value Date   TESTOSTERONE 657 09/20/2015    Assessment & Plan:    1. Hypogonadism in male Will check a serum testosterone, estradiol and SHBG  - Testosterone - Sex hormone binding globulin - Estradiol    [name] Stoioff, MD  [facility] [address], [address] [place], [place] [address] [phone]

## 2017-07-27 LAB — SEX HORMONE BINDING GLOBULIN: Sex Hormone Binding: 29.7 nmol/L (ref 19.3–76.4)

## 2017-07-27 LAB — ESTRADIOL: Estradiol: 7.6 pg/mL (ref 7.6–42.6)

## 2017-07-27 LAB — TESTOSTERONE: Testosterone: 1138 ng/dL — ABNORMAL HIGH (ref 264–916)

## 2017-07-30 ENCOUNTER — Telehealth: Payer: Self-pay

## 2017-07-30 DIAGNOSIS — E291 Testicular hypofunction: Secondary | ICD-10-CM

## 2017-07-30 NOTE — Telephone Encounter (Signed)
-----   Message from [name] Stoioff, MD sent at 07/29/2017  8:45 AM EST ----- T level elevated at 1138.  Recc changing dose to 100 mg every 10 days.  Repeat T level in 6 weeks

## 2017-07-30 NOTE — Telephone Encounter (Signed)
Spoke with pt in reference to testosterone levels and injecting q10days. Also made pt aware will need labs in 6 weeks. Pt voiced understanding. Lab appt made and orders placed.

## 2017-09-12 ENCOUNTER — Other Ambulatory Visit: Payer: Self-pay

## 2017-09-18 ENCOUNTER — Other Ambulatory Visit: Payer: BC Managed Care – PPO

## 2017-09-18 DIAGNOSIS — E291 Testicular hypofunction: Secondary | ICD-10-CM

## 2017-09-19 ENCOUNTER — Encounter: Payer: Self-pay | Admitting: Family Medicine

## 2017-09-19 ENCOUNTER — Telehealth: Payer: Self-pay | Admitting: Family Medicine

## 2017-09-19 ENCOUNTER — Encounter (INDEPENDENT_AMBULATORY_CARE_PROVIDER_SITE_OTHER): Payer: Self-pay

## 2017-09-19 LAB — TESTOSTERONE: Testosterone: 405 ng/dL (ref 264–916)

## 2017-09-19 NOTE — Telephone Encounter (Signed)
Letter Sent.

## 2017-09-19 NOTE — Telephone Encounter (Signed)
Please check mychart message pt sent Wednesday morning.  Please give him a call to discuss.

## 2017-09-19 NOTE — Telephone Encounter (Signed)
-----   Message from [name] Stoioff, MD sent at 09/19/2017  7:22 AM EST ----- Repeat testosterone level looks good at 405.  Continue present dose.

## 2017-09-20 NOTE — Telephone Encounter (Signed)
Pt has made an appt to speak with Dr. Lonna CobbStoioff.

## 2017-09-21 ENCOUNTER — Other Ambulatory Visit: Payer: Self-pay | Admitting: Urology

## 2017-09-21 MED ORDER — ANASTROZOLE 1 MG PO TABS: 1.0000 mg | ORAL_TABLET | Freq: Every day | ORAL | 3 refills | Status: DC

## 2017-09-21 MED ORDER — TESTOSTERONE CYPIONATE 200 MG/ML IM SOLN: INTRAMUSCULAR | 0 refills | Status: DC

## 2017-09-25 ENCOUNTER — Ambulatory Visit: Payer: BC Managed Care – PPO | Admitting: Urology

## 2017-10-14 ENCOUNTER — Ambulatory Visit
Admission: EM | Admit: 2017-10-14 | Discharge: 2017-10-14 | Disposition: A | Discharge status: Home / Self Care | Payer: BC Managed Care – PPO | Attending: Emergency Medicine | Admitting: Emergency Medicine

## 2017-10-14 ENCOUNTER — Other Ambulatory Visit: Payer: Self-pay

## 2017-10-14 ENCOUNTER — Encounter: Payer: Self-pay | Admitting: Gynecology

## 2017-10-14 DIAGNOSIS — L03116 Cellulitis of left lower limb: Secondary | ICD-10-CM

## 2017-10-14 MED ORDER — CEPHALEXIN 500 MG PO CAPS: 500.0000 mg | ORAL_CAPSULE | Freq: Four times a day (QID) | ORAL | 0 refills | Status: AC

## 2017-10-14 MED ORDER — MUPIROCIN 2 % EX OINT: TOPICAL_OINTMENT | CUTANEOUS | 0 refills | Status: DC

## 2017-10-14 NOTE — ED Triage Notes (Signed)
Patient c/o recurrent cellulitis at left lower leg.

## 2017-10-14 NOTE — Discharge Instructions (Signed)
Take medication as prescribed. Rest. Drink plenty of fluids.  Elevate.  Monitor.  Follow up with your primary care physician this week as needed. Return to Urgent care for new or worsening concerns.   

## 2017-10-14 NOTE — ED Provider Notes (Signed)
[facility] ____________________________________________  Time seen: Approximately 8:22 AM  I have reviewed the triage vital signs and the nursing notes.   HISTORY  Chief Complaint Recurrent Skin Infections   HPI Rodney Howard is a 52 y.o. male presenting with wife at bedside for evaluation of recurrent left lower leg cellulitis.  Patient reports that since Friday he has had redness starting to left lower anterior leg.  Reports history of this happening multiple times in the past with last being approximately 2 months ago and he was treated with Keflex.  States Keflex usually works well for him.  Denies known history of MRSA.  Denies known break in skin, trauma or injury.  Reports multiple previous left knee replacements.  Reports he has chronic left leg lymphedema, denies any acute swelling changes.  States normal discomfort in his left leg and denies any other additional pain or discomfort.  Denies any change in chronic left foot paresthesias consistent with his normal peripheral neuropathy.  Patient and wife again reports no difference in left leg swelling compared to baseline present for years.  Reports otherwise feels well.  No accompanying fevers.  Denies known trigger. Denies chest pain, shortness of breath, abdominal pain. Denies recent sickness. Denies recent antibiotic use.   Rolm GalaGrandis, [name], MD: PCP   Past Medical History:  Diagnosis Date  . ADD (attention deficit disorder)   . Anxiety   . Bipolar 1 disorder (HCC)   . BPH (benign prostatic hyperplasia)   . DDD (degenerative disc disease), cervical   . Depression   . Erectile dysfunction   . GERD (gastroesophageal reflux disease)   . Gout   . HLD (hyperlipidemia)   . HTN (hypertension)   . Hypogonadism in male   . Lymphedema   . Nocturia   . Obesity   . Obesity   . Osteoarthritis   . Peripheral neuropathy    feet  . S/P insertion of spinal cord stimulator   . Sleep apnea    CPAP  . Tinea cruris      Patient Active Problem List   Diagnosis Date Noted  . Personal history of colonic polyps   . Rectal polyp   . Umbilical hernia without obstruction and without gangrene   . Pure hypercholesterolemia 02/01/2016  . Arthritis, degenerative 02/01/2016  . Adiposity 02/01/2016  . Gout 02/01/2016  . Acid reflux 02/01/2016  . Essential (primary) hypertension 02/01/2016  . Narrowing of intervertebral disc space 02/01/2016  . Bipolar I disorder, most recent episode depressed (HCC) 02/01/2016  . Other specified behavioral and emotional disorders with onset usually occurring in childhood and adolescence 02/01/2016  . Encounter for preprocedural cardiovascular examination 11/09/2015  . H/O total knee replacement 10/25/2015  . Morbid obesity (HCC) 10/25/2015  . Loosening of knee joint prosthesis (HCC) 10/25/2015  . Hypogonadism in male 09/22/2015  . Erectile dysfunction of organic origin 09/22/2015  . BPH with obstruction/lower urinary tract symptoms 09/22/2015  . History of surgical procedure 04/22/2015  . Acquired lymphedema 02/23/2014  . Obstructive apnea 10/13/2013  . Fatty infiltration of liver 10/07/2012  . Arteriosclerosis of coronary artery 10/07/2012    Past Surgical History:  Procedure Laterality Date  . BACK SURGERY    . CARDIAC CATHETERIZATION    . COLONOSCOPY WITH PROPOFOL N/A 06/25/2017   Procedure: COLONOSCOPY WITH PROPOFOL;  Surgeon: [name], [name], MD;  [facility];  Service: Endoscopy;  Laterality: N/A;  sleep apnea  . JOINT REPLACEMENT     x 3  . LUMBAR  DISC SURGERY     x 3  . POLYPECTOMY N/A 06/25/2017   Procedure: POLYPECTOMY;  Surgeon: Midge Minium, MD;  Location: [facility];  Service: Endoscopy;  Laterality: N/A;  . STOMACH SURGERY     gastric sleeve  . TOE SURGERY    . TOTAL KNEE ARTHROPLASTY Left 2014  . UMBILICAL HERNIA REPAIR N/A 02/17/2016   Procedure: HERNIA REPAIR UMBILICAL ADULT;  Surgeon: Lattie Haw, MD;  Location: [facility];  Service: General;  Laterality: N/A;  . WRIST SURGERY       No current facility-administered medications for this encounter.   Current Outpatient Medications:  .  anastrozole (ARIMIDEX) 1 MG tablet, Take 1 tablet (1 mg total) by mouth daily., Disp: 30 tablet, Rfl: 3 .  ARIPiprazole (ABILIFY) 15 MG tablet, Take 15 mg by mouth at bedtime. Reported on 09/22/2015, Disp: , Rfl:  .  aspirin 81 MG chewable tablet, Chew 81 mg by mouth daily. , Disp: , Rfl:  .  bumetanide (BUMEX) 1 MG tablet, Take 2 mg by mouth daily as needed (diuretic). , Disp: , Rfl:  .  CALCIUM PO, Take 1,500 mg by mouth daily., Disp: , Rfl:  .  Cholecalciferol 2000 UNITS TABS, Take 2,000 Units by mouth daily. Reported on 02/03/2016, Disp: , Rfl:  .  clonazePAM (KLONOPIN) 1 MG tablet, Take 1 mg by mouth as needed for anxiety., Disp: , Rfl:  .  colchicine 0.6 MG tablet, Take 1 mg by mouth as needed (gout flare up). Reported on 09/22/2015, Disp: , Rfl: 3 .  Docusate Sodium (COLACE PO), Take 100 mg by mouth 2 (two) times daily. , Disp: , Rfl:  .  lamoTRIgine (LAMICTAL) 200 MG tablet, Take 200 mg by mouth 2 (two) times daily. Reported on 09/22/2015, Disp: , Rfl:  .  lisinopril (PRINIVIL,ZESTRIL) 10 MG tablet, Take by mouth. Reported on 09/22/2015, Disp: , Rfl:  .  metoprolol succinate (TOPROL-XL) 100 MG 24 hr tablet, Take 100 mg by mouth at bedtime. , Disp: , Rfl:  .  modafinil (PROVIGIL) 200 MG tablet, Take 200 mg by mouth 2 (two) times daily. , Disp: , Rfl:  .  Multiple Vitamin (MULTIVITAMIN) tablet, Take 1 tablet by mouth daily., Disp: , Rfl:  .  oxyCODONE (OXYCONTIN) 20 mg 12 hr tablet, Take 20 mg by mouth daily. 8 PM, Disp: , Rfl:  .  oxyCODONE (OXYCONTIN) 30 MG 12 hr tablet, Take 30 mg by mouth 2 (two) times daily. 4 AM and 12 Noon, Disp: , Rfl:  .  oxyCODONE-acetaminophen (PERCOCET) 10-325 MG tablet, Take 1 tablet by mouth every 4 (four) hours as needed for pain., Disp: 20 tablet, Rfl: 0 .  pantoprazole (PROTONIX) 40 MG tablet,  Take 40 mg by mouth daily., Disp: , Rfl:  .  rosuvastatin (CRESTOR) 10 MG tablet, Take 10 mg by mouth daily., Disp: , Rfl:  .  senna-docusate (SENOKOT-S) 8.6-50 MG tablet, Take 1 tablet by mouth at bedtime. , Disp: , Rfl:  .  sildenafil (VIAGRA) 100 MG tablet, Take by mouth., Disp: , Rfl:  .  testosterone cypionate (DEPOTESTOSTERONE CYPIONATE) 200 MG/ML injection, Inject 0.7 cc every 10 days., Disp: 10 mL, Rfl: 0 .  tranylcypromine (PARNATE) 10 MG tablet, Take 30 mg by mouth 3 (three) times daily. Reported on 02/03/2016, Disp: , Rfl:  .  cephALEXin (KEFLEX) 500 MG capsule, Take 1 capsule (500 mg total) by mouth 4 (four) times daily for 7 days., Disp: 28 capsule, Rfl: 0 .  mupirocin  ointment (BACTROBAN) 2 %, Apply two times a day for 7 days., Disp: 22 g, Rfl: 0  Allergies Atorvastatin; Demerol [meperidine]; Statins; and Other  Family History  Problem Relation Age of Onset  . Arthritis Mother   . Deep vein thrombosis Mother   . Uterine cancer Mother   . Heart disease Mother   . Cirrhosis Sister   . Heart disease Father   . Diabetes Mellitus II Father        Mother, Sister  . Parkinson's disease Father   . Stroke Father   . Kidney disease Neg Hx   . Prostate cancer Neg Hx     Social History Social History   Tobacco Use  . Smoking status: Never Smoker  . Smokeless tobacco: Never Used  Substance Use Topics  . Alcohol use: Yes    Alcohol/week: 1.2 oz    Types: 2 Cans of beer per week    Comment: occasional  . Drug use: No    Review of Systems Constitutional: No fever/chills Cardiovascular: Denies chest pain. Respiratory: Denies shortness of breath. Gastrointestinal: No abdominal pain.    ____________________________________________   PHYSICAL EXAM:  VITAL SIGNS: ED Triage Vitals  Enc Vitals Group     BP 10/14/17 0815 131/74     Pulse  80     Resp 10/14/17 0815 18     Temp 10/14/17 0815 98.8 F (37.1 C)     Temp Source 10/14/17 0815 Oral     SpO2 10/14/17 0815  100 %     Weight 10/14/17 0814 (!) 335 lb (152 kg)     Height 10/14/17 0814 6\' 3"  (1.905 m)     Head Circumference --      Peak Flow --      Pain Score 10/14/17 0813 1     Pain Loc --      Pain Edu? --      Excl. in GC? --     Constitutional: Alert and oriented. Well appearing and in no acute distress. Cardiovascular: Normal rate, regular rhythm. Grossly normal heart sounds.  Good peripheral circulation. Respiratory: Normal respiratory effort without tachypnea nor retractions. Breath sounds are clear and equal bilaterally. No wheezes, rales, rhonchi. Musculoskeletal:  Bilateral pedal pulses equal and easily palpated. Steady gait.  Neurologic:  Normal speech and language. No gross focal neurologic deficits are appreciated. Speech is normal. No gait instability.  Skin:  Skin is warm, dry.  Except: Left lower leg nonpitting swelling noted, per patient chronic, no right leg edema noted.  Left anterior lower pretibial area of mild erythema, one area less than 0.25 cm superficial abrasion noted, skin otherwise intact, no drainage, no leaking, leg nontender to palpation, normal capillary refill. Psychiatric: Mood and affect are normal. Speech and behavior are normal. Patient exhibits appropriate insight and judgment   ___________________________________________   LABS (all labs ordered are listed, but only abnormal results are displayed)  Labs Reviewed - No data to display _ PROCEDURES Procedures   INITIAL IMPRESSION / ASSESSMENT AND PLAN / ED COURSE  Pertinent labs & imaging results that were available during my care of the patient were reviewed by me and considered in my medical decision making (see chart for details).  Well-appearing patient.  No acute distress.  Presenting for evaluation of left lower leg recurrent cellulitis, with current appearance consistent with same.  Chronic left lymphedema, encourage continue follow-up, continue compression stockings, vascular evaluation.   Denies history of MRSA.  We will treat patient with oral Keflex.  Topical Bactroban.  Discussed strict follow-up and return parameters.  Discussed follow up with Primary care physician this week. Discussed follow up and return parameters including no resolution or any worsening concerns. Patient verbalized understanding and agreed to plan.   ____________________________________________   FINAL CLINICAL IMPRESSION(S) / ED DIAGNOSES  Final diagnoses:  Left leg cellulitis     ED Discharge Orders        Ordered    mupirocin ointment (BACTROBAN) 2 %     10/14/17 0838    cephALEXin (KEFLEX) 500 MG capsule  4 times daily     10/14/17 1610       Note: This dictation was prepared with Dragon dictation along with smaller phrase technology. Any transcriptional errors that result from this process are unintentional.         Renford Dills, NP 10/14/17 [phone]

## 2017-10-26 ENCOUNTER — Ambulatory Visit (INDEPENDENT_AMBULATORY_CARE_PROVIDER_SITE_OTHER): Payer: BC Managed Care – PPO | Admitting: Vascular Surgery

## 2017-10-26 ENCOUNTER — Encounter (INDEPENDENT_AMBULATORY_CARE_PROVIDER_SITE_OTHER): Payer: Self-pay | Admitting: Vascular Surgery

## 2017-10-26 VITALS — BP 125/79 | HR 80 | Resp 17 | Ht 75.0 in | Wt 337.0 lb

## 2017-10-26 DIAGNOSIS — Z96652 Presence of left artificial knee joint: Secondary | ICD-10-CM | POA: Diagnosis not present

## 2017-10-26 DIAGNOSIS — E669 Obesity, unspecified: Secondary | ICD-10-CM | POA: Insufficient documentation

## 2017-10-26 DIAGNOSIS — I890 Lymphedema, not elsewhere classified: Secondary | ICD-10-CM | POA: Insufficient documentation

## 2017-10-26 DIAGNOSIS — I10 Essential (primary) hypertension: Secondary | ICD-10-CM | POA: Diagnosis not present

## 2017-10-26 NOTE — Assessment & Plan Note (Signed)
With subsequent removal and replacement.  Has clearly worsened his left lower extremity lymphedema.

## 2017-10-26 NOTE — Assessment & Plan Note (Signed)
This exacerbates his lower extremity swelling.  Increasing activity and weight loss may help with swelling.

## 2017-10-26 NOTE — Assessment & Plan Note (Signed)
The patient has had some good results with compression stockings and elevation, but has recurrent left lower extremity cellulitis and lymphedema.  He has multiple reasons for lymphedema including genetic history, multiple knee operations, and recurrent cellulitis.  He clearly would benefit from a lymphedema pump as adjuvant therapy.  It is also prudent to check for venous reflux to see if associated venous disease is also present.  He will continue his conservative measures and I will see him back with a noninvasive study in the near future at his convenience.

## 2017-10-26 NOTE — Assessment & Plan Note (Signed)
blood pressure control important in reducing the progression of atherosclerotic disease. On appropriate oral medications.  

## 2017-10-26 NOTE — Progress Notes (Signed)
Patient ID: Rodney Howard, male   DOB: February 10, 1966, 52 y.o.   MRN: [id number]  Chief Complaint  Patient presents with  . New Patient (Initial Visit)    Venous insuff.    HPI Rodney Howard is a 52 y.o. male.  I am asked to see the patient by Dr. Gavin Potters for evaluation of leg pain, swelling and recurrent cellulitis.  The patient reports multiple family members who have been treated for lymphedema.  He has had 4 episodes of cellulitis of the left lower extremity and was on long-standing antibiotics at one point for major knee surgeries including removal and replacement of a knee replacement.  These were all done on his symptomatic, left leg.  He wears compression stockings every day and nothing is helped as much as that.  He has a lymphedema pump and although he does not really like it that much it helps some.  He has been on and off antibiotics over the years and has had multiple cellulitis bouts that are difficult to treat.  He has had DVT workup, but no other venous evaluation.   Past Medical History:  Diagnosis Date  . ADD (attention deficit disorder)   . Anxiety   . Bipolar 1 disorder (HCC)   . BPH (benign prostatic hyperplasia)   . DDD (degenerative disc disease), cervical   . Depression   . Erectile dysfunction   . GERD (gastroesophageal reflux disease)   . Gout   . HLD (hyperlipidemia)   . HTN (hypertension)   . Hypogonadism in male   . Lymphedema   . Nocturia   . Obesity   . Obesity   . Osteoarthritis   . Peripheral neuropathy    feet  . S/P insertion of spinal cord stimulator   . Sleep apnea    CPAP  . Tinea cruris     Past Surgical History:  Procedure Laterality Date  . BACK SURGERY    . CARDIAC CATHETERIZATION    . COLONOSCOPY WITH PROPOFOL N/A 06/25/2017   Procedure: COLONOSCOPY WITH PROPOFOL;  Surgeon: Midge Minium, MD;  Location: [facility];  Service: Endoscopy;  Laterality: N/A;  sleep apnea  . JOINT REPLACEMENT     x 3  . LUMBAR DISC  SURGERY     x 3  . POLYPECTOMY N/A 06/25/2017   Procedure: POLYPECTOMY;  Surgeon: Midge Minium, MD;  Location: [facility];  Service: Endoscopy;  Laterality: N/A;  . STOMACH SURGERY     gastric sleeve  . TOE SURGERY    . TOTAL KNEE ARTHROPLASTY Left 2014  . UMBILICAL HERNIA REPAIR N/A 02/17/2016   Procedure: HERNIA REPAIR UMBILICAL ADULT;  Surgeon: Lattie Haw, MD;  Location: [facility];  Service: General;  Laterality: N/A;  . WRIST SURGERY      Family History  Problem Relation Age of Onset  . Arthritis Mother   . Deep vein thrombosis Mother   . Uterine cancer Mother   . Heart disease Mother   . Cirrhosis Sister   . Heart disease Father   . Diabetes Mellitus II Father        Mother, Sister  . Parkinson's disease Father   . Stroke Father   . Kidney disease Neg Hx   . Prostate cancer Neg Hx      Social History Social History   Tobacco Use  . Smoking status: Never Smoker  . Smokeless tobacco: Never Used  Substance Use Topics  . Alcohol use: Yes  Alcohol/week: 1.2 oz    Types: 2 Cans of beer per week    Comment: occasional  . Drug use: No     Allergies  Allergen Reactions  . Atorvastatin Other (See Comments)    Leg pain and severe headaches  . Demerol [Meperidine]     Interacts with other medication, causes blood pressure to raise  . Statins Other (See Comments)    Headaches/migraines  . Other Swelling and Rash    dermabond    Current Outpatient Medications  Medication Sig Dispense Refill  . anastrozole (ARIMIDEX) 1 MG tablet Take 1 tablet (1 mg total) by mouth daily. 30 tablet 3  . ARIPiprazole (ABILIFY) 15 MG tablet Take 15 mg by mouth at bedtime. Reported on 09/22/2015    . aspirin 81 MG chewable tablet Chew 81 mg by mouth daily.     . bumetanide (BUMEX) 1 MG tablet Take 2 mg by mouth daily as needed (diuretic).     . CALCIUM PO Take 1,500 mg by mouth daily.    . Cholecalciferol 2000 UNITS TABS Take 2,000 Units by mouth daily. Reported on  02/03/2016    . colchicine 0.6 MG tablet Take 1 mg by mouth as needed (gout flare up). Reported on 09/22/2015  3  . Docusate Sodium (COLACE PO) Take 100 mg by mouth 2 (two) times daily.     Jae Dire Bandages & Supports (RELIEF KNEE) MISC Use as directed    . lamoTRIgine (LAMICTAL) 200 MG tablet Take 200 mg by mouth 2 (two) times daily. Reported on 09/22/2015    . lisinopril (PRINIVIL,ZESTRIL) 10 MG tablet Take by mouth. Reported on 09/22/2015    . metoprolol succinate (TOPROL-XL) 100 MG 24 hr tablet Take 100 mg by mouth at bedtime.     . modafinil (PROVIGIL) 200 MG tablet Take 200 mg by mouth 2 (two) times daily.     . Multiple Vitamin (MULTIVITAMIN) tablet Take 1 tablet by mouth daily.    . mupirocin ointment (BACTROBAN) 2 % Apply two times a day for 7 days. 22 g 0  . oxyCODONE (OXYCONTIN) 20 mg 12 hr tablet Take 15 mg by mouth daily. 8 PM     . oxyCODONE-acetaminophen (PERCOCET) 10-325 MG tablet Take 1 tablet by mouth every 4 (four) hours as needed for pain. 20 tablet 0  . pantoprazole (PROTONIX) 40 MG tablet Take 40 mg by mouth daily.    Marland Kitchen PREVIDENT 5000 DRY MOUTH 1.1 % GEL dental gel See admin instructions.  5  . rosuvastatin (CRESTOR) 10 MG tablet Take 10 mg by mouth daily.    Marland Kitchen senna-docusate (SENOKOT-S) 8.6-50 MG tablet Take 1 tablet by mouth at bedtime.     . sildenafil (VIAGRA) 100 MG tablet Take by mouth.    . testosterone cypionate (DEPOTESTOSTERONE CYPIONATE) 200 MG/ML injection Inject 0.7 cc every 10 days. 10 mL 0  . tranylcypromine (PARNATE) 10 MG tablet Take 30 mg by mouth 3 (three) times daily. Reported on 02/03/2016    . clonazePAM (KLONOPIN) 1 MG tablet Take 1 mg by mouth as needed for anxiety.     No current facility-administered medications for this visit.       REVIEW OF SYSTEMS (Negative unless checked)  Constitutional: [] Weight loss  [] Fever  [] Chills Cardiac: [] Chest pain   [] Chest pressure   [] Palpitations   [] Shortness of breath when laying flat   [] Shortness of breath  at rest   [x] Shortness of breath with exertion. Vascular:  [] Pain in legs with walking   []   Pain in legs at rest   [] Pain in legs when laying flat   [] Claudication   [] Pain in feet when walking  [] Pain in feet at rest  [] Pain in feet when laying flat   [] History of DVT   [] Phlebitis   [x] Swelling in legs   [] Varicose veins   [] Non-healing ulcers Pulmonary:   [] Uses home oxygen   [] Productive cough   [] Hemoptysis   [] Wheeze  [] COPD   [] Asthma Neurologic:  [] Dizziness  [] Blackouts   [] Seizures   [] History of stroke   [] History of TIA  [] Aphasia   [] Temporary blindness   [] Dysphagia   [] Weakness or numbness in arms   [] Weakness or numbness in legs Musculoskeletal:  [x] Arthritis   [] Joint swelling   [x] Joint pain   [] Low back pain Hematologic:  [] Easy bruising  [] Easy bleeding   [] Hypercoagulable state   [] Anemic  [] Hepatitis Gastrointestinal:  [] Blood in stool   [] Vomiting blood  [x] Gastroesophageal reflux/heartburn   [] Abdominal pain Genitourinary:  [] Chronic kidney disease   [] Difficult urination  [] Frequent urination  [] Burning with urination   [] Hematuria Skin:  [] Rashes   [] Ulcers   [] Wounds Psychological:  [] History of anxiety   []  History of major depression.    Physical Exam BP 125/79 (BP Location: Right Arm, Patient Position: Sitting)   Pulse 80   Resp 17   Ht 6\' 3"  (1.905 m)   Wt (!) 152.9 kg (337 lb)   BMI 42.12 kg/m  Gen:  WD/WN, NAD Head: [place]/AT, No temporalis wasting. Ear/Nose/Throat: Hearing grossly intact, nares w/o erythema or drainage, oropharynx w/o Erythema/Exudate Eyes: Conjunctiva clear, sclera non-icteric  Neck: trachea midline.  No JVD.  Pulmonary:  Good air movement, respirations not labored, no use of accessory muscles Cardiac: RRR, no JVD Vascular:  Vessel Right Left  Radial Palpable Palpable                          PT Palpable Palpable  DP Palpable Palpable    Musculoskeletal: M/S 5/5 throughout.  Extremities without ischemic changes.  No deformity or  atrophy. No RLE edema, 1-2+ LLE edema. Moderate stasis dermatitis on the left Neurologic: Sensation grossly intact in extremities.  Symmetrical.  Speech is fluent. Motor exam as listed above. Psychiatric: Judgment intact, Mood & affect appropriate for pt's clinical situation. Dermatologic: No rashes or ulcers noted.  No cellulitis or open wounds.  Radiology No results found.  Labs Recent Results (from the past 2160 hour(s))  Testosterone     Status: None   Collection Time: 09/18/17  8:49 AM  Result Value Ref Range   Testosterone 405 264 - 916 ng/dL    Comment: Adult male reference interval is based on a population of healthy nonobese males (BMI <30) between 6419 and [address]37 years old. Travison, et.al. JCEM [id number],102;1161-1173. PMID: [id number].     Assessment/Plan:  Essential (primary) hypertension blood pressure control important in reducing the progression of atherosclerotic disease. On appropriate oral medications.   Obesity This exacerbates his lower extremity swelling.  Increasing activity and weight loss may help with swelling.  H/O total knee replacement With subsequent removal and replacement.  Has clearly worsened his left lower extremity lymphedema.  Lymphedema The patient has had some good results with compression stockings and elevation, but has recurrent left lower extremity cellulitis and lymphedema.  He has multiple reasons for lymphedema including genetic history, multiple knee operations, and recurrent cellulitis.  He clearly would benefit from a lymphedema pump as adjuvant therapy.  It is also prudent to check for venous reflux to see if associated venous disease is also present.  He will continue his conservative measures and I will see him back with a noninvasive study in the near future at his convenience.      Festus Barren 10/26/2017, 2:28 PM   This note was created with Dragon medical transcription system.  Any errors from dictation are unintentional.

## 2017-11-07 ENCOUNTER — Other Ambulatory Visit: Payer: BC Managed Care – PPO

## 2017-11-07 DIAGNOSIS — E291 Testicular hypofunction: Secondary | ICD-10-CM

## 2017-11-08 LAB — TESTOSTERONE: Testosterone: 527 ng/dL (ref 264–916)

## 2017-11-12 ENCOUNTER — Encounter: Payer: Self-pay | Admitting: Urology

## 2017-11-12 ENCOUNTER — Other Ambulatory Visit: Payer: Self-pay

## 2017-11-12 MED ORDER — SILDENAFIL CITRATE 100 MG PO TABS: 100.0000 mg | ORAL_TABLET | ORAL | 11 refills | Status: DC | PRN

## 2017-11-13 ENCOUNTER — Encounter: Payer: Self-pay | Admitting: Urology

## 2017-11-13 ENCOUNTER — Other Ambulatory Visit: Payer: Self-pay

## 2017-11-13 ENCOUNTER — Telehealth: Payer: Self-pay

## 2017-11-13 MED ORDER — SILDENAFIL CITRATE 100 MG PO TABS: 100.0000 mg | ORAL_TABLET | ORAL | 3 refills | Status: DC | PRN

## 2017-11-13 NOTE — Telephone Encounter (Signed)
Patient came to office requesting that script printed should be for 88 pills . It was explained that this would need to be approved by Dr. Lonna CobbStoioff , the script previously printed was a refill from his chart and previous script.    Spoke with Dr. Lonna CobbStoioff, patient has been seen with in the year ok to write script for 88 pills and 3 refills.  Script printed and placed upfront for pick up. Patient notified

## 2017-11-13 NOTE — Telephone Encounter (Signed)
Pt sent a mychart message about increasing frequency of testosterone injections. Per Dr. Lonna CobbStoioff he is NOT going to increase the frequency. Pt was made aware via mychart message.

## 2017-11-14 ENCOUNTER — Ambulatory Visit (INDEPENDENT_AMBULATORY_CARE_PROVIDER_SITE_OTHER): Payer: BC Managed Care – PPO

## 2017-11-14 ENCOUNTER — Ambulatory Visit (INDEPENDENT_AMBULATORY_CARE_PROVIDER_SITE_OTHER): Payer: BC Managed Care – PPO | Admitting: Vascular Surgery

## 2017-11-14 ENCOUNTER — Telehealth: Payer: Self-pay

## 2017-11-14 ENCOUNTER — Encounter (INDEPENDENT_AMBULATORY_CARE_PROVIDER_SITE_OTHER): Payer: Self-pay | Admitting: Vascular Surgery

## 2017-11-14 VITALS — BP 132/82 | HR 80 | Resp 17 | Ht 75.0 in | Wt 341.0 lb

## 2017-11-14 DIAGNOSIS — R600 Localized edema: Secondary | ICD-10-CM | POA: Diagnosis not present

## 2017-11-14 DIAGNOSIS — I872 Venous insufficiency (chronic) (peripheral): Secondary | ICD-10-CM | POA: Diagnosis not present

## 2017-11-14 DIAGNOSIS — I890 Lymphedema, not elsewhere classified: Secondary | ICD-10-CM

## 2017-11-14 NOTE — Progress Notes (Signed)
Subjective:    Patient ID: Rodney Howard, male    DOB: 08-23-1965, 52 y.o.   MRN: [id number] Chief Complaint  Patient presents with  . Follow-up    LLE venous reflux   Patient presents to review vascular studies.  The patient was last seen on October 26, 2017 for evaluation of left lower extremity edema and recurrent cellulitis.  The patient has a past medical history of lymphedema to the bilateral lower extremity.  The patient's left lower extremity is worse when compared to his right.  The patient was diagnosed with acquired lymphedema many years ago after a left total knee replacement.  The patient engages in conservative therapy on a daily basis including wearing medical grade 1 compression socks, elevating his legs and remaining active with minimal improvement to the swelling and discomfort he experiences.  The patient also uses a lymphedema pump at least once a day for an hour.  Conservative therapy and use of a lymphedema pump has provided minimal control over his edema which has left him susceptible to recurrent bouts of cellulitis.  The patient is concerned as he does have a total knee replacement to the left knee and is afraid of his hardware becoming infected.  The patient underwent a left lower extremity venous reflux study which was notable for abnormal reflux in the common femoral vein, great saphenous vein from the groin to the proximal calf.  There is no evidence of deep vein or superficial thrombophlebitis.  The patient denies any active cellulitis right now.  The patient denies any fever, nausea vomiting.  Review of Systems  Constitutional: Negative.   HENT: Negative.   Eyes: Negative.   Respiratory: Negative.   Cardiovascular: Positive for leg swelling.       Lymphedema Chronic venous insufficiency Cellulitis  Gastrointestinal: Negative.   Endocrine: Negative.   Genitourinary: Negative.   Musculoskeletal: Negative.   Skin: Negative.   Allergic/Immunologic: Negative.     Neurological: Negative.   Hematological: Negative.   Psychiatric/Behavioral: Negative.       Objective:   Physical Exam  Constitutional: He is oriented to person, place, and time. He appears well-developed and well-nourished. No distress.  HENT:  Head: Normocephalic and atraumatic.  Eyes: Pupils are equal, round, and reactive to light. Conjunctivae are normal.  Neck: Normal range of motion.  Cardiovascular: Normal rate, regular rhythm, normal heart sounds and intact distal pulses.  Pulses:      Radial pulses are 2+ on the right side, and 2+ on the left side.       Dorsalis pedis pulses are 2+ on the right side, and 2+ on the left side.       Posterior tibial pulses are 2+ on the right side, and 2+ on the left side.  Pulmonary/Chest: Effort normal and breath sounds normal.  Musculoskeletal: Normal range of motion. He exhibits edema (Right lower extremity: Mild to moderate nonpitting edema noted.  Left lower extremity: Moderate 2+ pitting edema noted.).  Neurological: He is alert and oriented to person, place, and time.  Skin: He is not diaphoretic.  There is no active cellulitis to the bilateral lower extremity. Skin is intact to the bilateral lower extremity.  Psychiatric: He has a normal mood and affect. His behavior is normal. Judgment and thought content normal.  Vitals reviewed.  BP 132/82 (BP Location: Right Arm, Patient Position: Sitting)   Pulse 80   Resp 17   Ht 6\' 3"  (1.905 m)   Wt (!) 341 lb (154.7  kg)   BMI 42.62 kg/m   Past Medical History:  Diagnosis Date  . ADD (attention deficit disorder)   . Anxiety   . Bipolar 1 disorder (HCC)   . BPH (benign prostatic hyperplasia)   . DDD (degenerative disc disease), cervical   . Depression   . Erectile dysfunction   . GERD (gastroesophageal reflux disease)   . Gout   . HLD (hyperlipidemia)   . HTN (hypertension)   . Hypogonadism in male   . Lymphedema   . Nocturia   . Obesity   . Obesity   . Osteoarthritis   .  Peripheral neuropathy    feet  . S/P insertion of spinal cord stimulator   . Sleep apnea    CPAP  . Tinea cruris    Social History   Socioeconomic History  . Marital status: Married    Spouse name: Not on file  . Number of children: Not on file  . Years of education: Not on file  . Highest education level: Not on file  Occupational History  . Not on file  Social Needs  . Financial resource strain: Not on file  . Food insecurity:    Worry: Not on file    Inability: Not on file  . Transportation needs:    Medical: Not on file    Non-medical: Not on file  Tobacco Use  . Smoking status: Never Smoker  . Smokeless tobacco: Never Used  Substance and Sexual Activity  . Alcohol use: Yes    Alcohol/week: 1.2 oz    Types: 2 Cans of beer per week    Comment: occasional  . Drug use: No  . Sexual activity: Not on file  Lifestyle  . Physical activity:    Days per week: Not on file    Minutes per session: Not on file  . Stress: Not on file  Relationships  . Social connections:    Talks on phone: Not on file    Gets together: Not on file    Attends religious service: Not on file    Active member of club or organization: Not on file    Attends meetings of clubs or organizations: Not on file    Relationship status: Not on file  . Intimate partner violence:    Fear of current or ex partner: Not on file    Emotionally abused: Not on file    Physically abused: Not on file    Forced sexual activity: Not on file  Other Topics Concern  . Not on file  Social History Narrative  . Not on file   Past Surgical History:  Procedure Laterality Date  . BACK SURGERY    . CARDIAC CATHETERIZATION    . COLONOSCOPY WITH PROPOFOL N/A 06/25/2017   Procedure: COLONOSCOPY WITH PROPOFOL;  Surgeon: Midge Minium, MD;  Location: [facility];  Service: Endoscopy;  Laterality: N/A;  sleep apnea  . JOINT REPLACEMENT     x 3  . LUMBAR DISC SURGERY     x 3  . POLYPECTOMY N/A 06/25/2017    Procedure: POLYPECTOMY;  Surgeon: Midge Minium, MD;  Location: [facility];  Service: Endoscopy;  Laterality: N/A;  . STOMACH SURGERY     gastric sleeve  . TOE SURGERY    . TOTAL KNEE ARTHROPLASTY Left 2014  . UMBILICAL HERNIA REPAIR N/A 02/17/2016   Procedure: HERNIA REPAIR UMBILICAL ADULT;  Surgeon: Lattie Haw, MD;  Location: [facility];  Service: General;  Laterality: N/A;  .  WRIST SURGERY     Family History  Problem Relation Age of Onset  . Arthritis Mother   . Deep vein thrombosis Mother   . Uterine cancer Mother   . Heart disease Mother   . Cirrhosis Sister   . Heart disease Father   . Diabetes Mellitus II Father        Mother, Sister  . Parkinson's disease Father   . Stroke Father   . Kidney disease Neg Hx   . Prostate cancer Neg Hx    Allergies  Allergen Reactions  . Atorvastatin Other (See Comments)    Leg pain and severe headaches  . Demerol [Meperidine]     Interacts with other medication, causes blood pressure to raise  . Statins Other (See Comments)    Headaches/migraines  . Other Swelling and Rash    dermabond      Assessment & Plan:  Patient presents to review vascular studies.  The patient was last seen on October 26, 2017 for evaluation of left lower extremity edema and recurrent cellulitis.  The patient has a past medical history of lymphedema to the bilateral lower extremity.  The patient's left lower extremity is worse when compared to his right.  The patient was diagnosed with acquired lymphedema many years ago after a left total knee replacement.  The patient engages in conservative therapy on a daily basis including wearing medical grade 1 compression socks, elevating his legs and remaining active with minimal improvement to the swelling and discomfort he experiences.  The patient also uses a lymphedema pump at least once a day for an hour.  Conservative therapy and use of a lymphedema pump has provided minimal control over his edema which has left  him susceptible to recurrent bouts of cellulitis.  The patient is concerned as he does have a total knee replacement to the left knee and is afraid of his hardware becoming infected.  The patient underwent a left lower extremity venous reflux study which was notable for abnormal reflux in the common femoral vein, great saphenous vein from the groin to the proximal calf.  There is no evidence of deep vein or superficial thrombophlebitis.  The patient denies any active cellulitis right now.  The patient denies any fever, nausea vomiting.  1. Chronic venous insufficiency - New Despite conservative treatments including exercise, elevation, class I compression socks and a lymphedema pump the patient still experiences uncontrolled left lower extremity edema and recurrent cellulitis.  The patient has been engaging in conservative therapy and using a lymphedema pump for years. The patient experiences discomfort with his uncontrolled edema.  The patient's symptoms have progressed to the point he is unable to function on a daily basis.  His symptoms have become lifestyle limiting. The patient is at risk for infection of the hardware in his left knee being status post a left total knee replacement The patient is likely to benefit from endovenous laser ablation. I have discussed the risks and benefits of the procedure. The risks primarily include DVT, recanalization, bleeding, infection, and inability to gain access. I will applied to the patient's insurance to undergo a left lower extremity endovenous laser ablation of the greater saphenous vein We will call the patient with insurance approval  2. Lymphedema - Stable I will be applying to the patient's insurance for left great saphenous vein endovenous laser ablation The patient is to continue engaging in conservative therapy including wearing medical grade 1 compression socks, elevating his legs and remaining active The patient was  encouraged to use lymphedema  pump at least twice a day for an hour each time The patient expresses understanding  Current Outpatient Medications on File Prior to Visit  Medication Sig Dispense Refill  . anastrozole (ARIMIDEX) 1 MG tablet Take 1 tablet (1 mg total) by mouth daily. 30 tablet 3  . ARIPiprazole (ABILIFY) 15 MG tablet Take 15 mg by mouth at bedtime. Reported on 09/22/2015    . aspirin 81 MG chewable tablet Chew 81 mg by mouth daily.     . bumetanide (BUMEX) 1 MG tablet Take 2 mg by mouth daily as needed (diuretic).     . CALCIUM PO Take 1,500 mg by mouth daily.    . Cholecalciferol 2000 UNITS TABS Take 2,000 Units by mouth daily. Reported on 02/03/2016    . clonazePAM (KLONOPIN) 1 MG tablet Take 1 mg by mouth as needed for anxiety.    . colchicine 0.6 MG tablet Take 1 mg by mouth as needed (gout flare up). Reported on 09/22/2015  3  . Docusate Sodium (COLACE PO) Take 100 mg by mouth 2 (two) times daily.     Jae Dire. Elastic Bandages & Supports (RELIEF KNEE) MISC Use as directed    . lamoTRIgine (LAMICTAL) 200 MG tablet Take 200 mg by mouth 2 (two) times daily. Reported on 09/22/2015    . lisinopril (PRINIVIL,ZESTRIL) 10 MG tablet Take by mouth. Reported on 09/22/2015    . metoprolol succinate (TOPROL-XL) 100 MG 24 hr tablet Take 100 mg by mouth at bedtime.     . modafinil (PROVIGIL) 200 MG tablet Take 200 mg by mouth 2 (two) times daily.     . Multiple Vitamin (MULTIVITAMIN) tablet Take 1 tablet by mouth daily.    . mupirocin ointment (BACTROBAN) 2 % Apply two times a day for 7 days. 22 g 0  . oxyCODONE (OXYCONTIN) 20 mg 12 hr tablet Take 15 mg by mouth daily. 8 PM     . oxyCODONE-acetaminophen (PERCOCET) 10-325 MG tablet Take 1 tablet by mouth every 4 (four) hours as needed for pain. 20 tablet 0  . pantoprazole (PROTONIX) 40 MG tablet Take 40 mg by mouth daily.    Marland Kitchen. PREVIDENT 5000 DRY MOUTH 1.1 % GEL dental gel See admin instructions.  5  . rosuvastatin (CRESTOR) 10 MG tablet Take 10 mg by mouth daily.    Marland Kitchen. senna-docusate  (SENOKOT-S) 8.6-50 MG tablet Take 1 tablet by mouth at bedtime.     . sildenafil (VIAGRA) 100 MG tablet Take 1 tablet (100 mg total) by mouth as needed for erectile dysfunction. 88 tablet 3  . testosterone cypionate (DEPOTESTOSTERONE CYPIONATE) 200 MG/ML injection Inject 0.7 cc every 10 days. 10 mL 0  . tranylcypromine (PARNATE) 10 MG tablet Take 30 mg by mouth 3 (three) times daily. Reported on 02/03/2016     No current facility-administered medications on file prior to visit.    There are no Patient Instructions on file for this visit. No follow-ups on file.  Aleksei Goodlin A Amaan Meyer, PA-C

## 2017-11-14 NOTE — Telephone Encounter (Signed)
Pt was made aware via mychart. °

## 2017-11-14 NOTE — Telephone Encounter (Signed)
-----   Message from [name] Stoioff, MD sent at 11/13/2017  2:27 PM EDT ----- Testosterone level was 527

## 2017-11-16 ENCOUNTER — Encounter (INDEPENDENT_AMBULATORY_CARE_PROVIDER_SITE_OTHER): Payer: Self-pay | Admitting: Vascular Surgery

## 2017-12-05 DIAGNOSIS — L219 Seborrheic dermatitis, unspecified: Secondary | ICD-10-CM | POA: Insufficient documentation

## 2017-12-25 ENCOUNTER — Encounter: Payer: Self-pay | Admitting: Urology

## 2017-12-25 ENCOUNTER — Encounter (INDEPENDENT_AMBULATORY_CARE_PROVIDER_SITE_OTHER): Payer: Self-pay | Admitting: Vascular Surgery

## 2017-12-28 ENCOUNTER — Other Ambulatory Visit (INDEPENDENT_AMBULATORY_CARE_PROVIDER_SITE_OTHER): Payer: BC Managed Care – PPO | Admitting: Vascular Surgery

## 2017-12-31 ENCOUNTER — Encounter (INDEPENDENT_AMBULATORY_CARE_PROVIDER_SITE_OTHER): Payer: BC Managed Care – PPO

## 2018-01-09 ENCOUNTER — Other Ambulatory Visit: Payer: Self-pay | Admitting: Family Medicine

## 2018-01-09 ENCOUNTER — Other Ambulatory Visit: Payer: BC Managed Care – PPO

## 2018-01-09 DIAGNOSIS — E291 Testicular hypofunction: Secondary | ICD-10-CM

## 2018-01-10 LAB — TESTOSTERONE: TESTOSTERONE: 473 ng/dL (ref 264–916)

## 2018-01-15 ENCOUNTER — Ambulatory Visit: Payer: BC Managed Care – PPO | Admitting: Podiatry

## 2018-01-15 ENCOUNTER — Encounter: Payer: Self-pay | Admitting: Podiatry

## 2018-01-15 DIAGNOSIS — B070 Plantar wart: Secondary | ICD-10-CM | POA: Diagnosis not present

## 2018-01-15 DIAGNOSIS — F988 Other specified behavioral and emotional disorders with onset usually occurring in childhood and adolescence: Secondary | ICD-10-CM | POA: Insufficient documentation

## 2018-01-15 NOTE — Progress Notes (Signed)
   Subjective: 52 year old male presenting today with a chief complaint of stabbing pain and soreness of the plantar aspect of the left heel that began 4 months ago. He states it feels as if he is walking on rocks. Walking for extended periods of time increases the pain. He has been applying salicylic acid for treatment. He reports a h/o plantar warts in the past. Patient is here for further evaluation and treatment.   Past Medical History:  Diagnosis Date  . ADD (attention deficit disorder)   . Anxiety   . Bipolar 1 disorder (HCC)   . BPH (benign prostatic hyperplasia)   . DDD (degenerative disc disease), cervical   . Depression   . Erectile dysfunction   . GERD (gastroesophageal reflux disease)   . Gout   . HLD (hyperlipidemia)   . HTN (hypertension)   . Hypogonadism in male   . Lymphedema   . Nocturia   . Obesity   . Obesity   . Osteoarthritis   . Peripheral neuropathy    feet  . S/P insertion of spinal cord stimulator   . Sleep apnea    CPAP  . Tinea cruris     Objective: Physical Exam General: The patient is alert and oriented x3 in no acute distress.  Dermatology: Hyperkeratotic skin lesion noted to the plantar aspect of the left foot approximately 1 cm in diameter. Pinpoint bleeding noted upon debridement. Skin is warm, dry and supple bilateral lower extremities. Negative for open lesions or macerations.  Vascular: Palpable pedal pulses bilaterally. No edema or erythema noted. Capillary refill within normal limits.  Neurological: Epicritic and protective threshold grossly intact bilaterally.   Musculoskeletal Exam: Pain on palpation to the note skin lesion.  Range of motion within normal limits to all pedal and ankle joints bilateral. Muscle strength 5/5 in all groups bilateral.   Assessment: #1 plantar wart left heel #2 pain in left foot   Plan of Care:  #1 Patient was evaluated. #2 Excisional debridement of the plantar wart lesion was performed  using a chisel blade. Cantharone was applied and the lesion was dressed with a dry sterile dressing. #3 patient is to return to clinic in 2 weeks  Felecia ShellingBrent M. Todrick Siedschlag, DPM [facility]  Dr. Felecia ShellingBrent M. Avonte Sensabaugh, DPM    [address]                                        [place], [place] [address]                Office [phone]  Fax [phone]

## 2018-01-16 ENCOUNTER — Encounter: Payer: Self-pay | Admitting: Urology

## 2018-01-16 ENCOUNTER — Ambulatory Visit (INDEPENDENT_AMBULATORY_CARE_PROVIDER_SITE_OTHER): Payer: BC Managed Care – PPO | Admitting: Urology

## 2018-01-16 VITALS — BP 152/92 | HR 92 | Ht 75.0 in | Wt 342.6 lb

## 2018-01-16 DIAGNOSIS — E291 Testicular hypofunction: Secondary | ICD-10-CM | POA: Diagnosis not present

## 2018-01-16 MED ORDER — TESTOSTERONE CYPIONATE 200 MG/ML IM SOLN: INTRAMUSCULAR | 0 refills | Status: DC

## 2018-01-16 MED ORDER — ANASTROZOLE 1 MG PO TABS
1.0000 mg | ORAL_TABLET | Freq: Every day | ORAL | 6 refills | Status: DC
Start: 2018-01-16 — End: 2018-07-22

## 2018-01-16 NOTE — Progress Notes (Signed)
01/16/2018 1:30 PM   Rodney Howard August 01, 1966 [id number]  Referring provider: Rolm GalaGrandis, [name], MD [address] [place] [address] [place], [place] [address]  Chief Complaint  Patient presents with  . Hypogonadism    low libido, low energy     HPI: 52 year old male presents for follow-up of hypogonadism.  His testosterone dose was increased to 0.7 cc every 10 days due to an elevated testosterone level of 1100.  He is on chronic opiate therapy.  His levels have been in the 4-500 range.  He does complain of increased libido.  He has no bothersome lower urinary tract symptoms.  He is also on anastrozole 1 mg daily.   PMH: Past Medical History:  Diagnosis Date  . ADD (attention deficit disorder)   . Anxiety   . Bipolar 1 disorder (HCC)   . BPH (benign prostatic hyperplasia)   . DDD (degenerative disc disease), cervical   . Depression   . Erectile dysfunction   . GERD (gastroesophageal reflux disease)   . Gout   . HLD (hyperlipidemia)   . HTN (hypertension)   . Hypogonadism in male   . Lymphedema   . Nocturia   . Obesity   . Obesity   . Osteoarthritis   . Peripheral neuropathy    feet  . S/P insertion of spinal cord stimulator   . Sleep apnea    CPAP  . Tinea cruris     Surgical History: Past Surgical History:  Procedure Laterality Date  . BACK SURGERY    . CARDIAC CATHETERIZATION    . COLONOSCOPY WITH PROPOFOL N/A 06/25/2017   Procedure: COLONOSCOPY WITH PROPOFOL;  Surgeon: [name], [name], MD;  [facility];  Service: Endoscopy;  Laterality: N/A;  sleep apnea  . JOINT REPLACEMENT     x 3  . LUMBAR DISC SURGERY     x 3  . POLYPECTOMY N/A 06/25/2017   Procedure: POLYPECTOMY;  Surgeon: [name], [name], MD;  [facility];  Service: Endoscopy;  Laterality: N/A;  . STOMACH SURGERY     gastric sleeve  . TOE SURGERY    . TOTAL KNEE ARTHROPLASTY Left 2014  . UMBILICAL HERNIA REPAIR N/A 02/17/2016   Procedure: HERNIA REPAIR UMBILICAL ADULT;  Surgeon:  [name], MD;  Location: [facility];  Service: General;  Laterality: N/A;  . WRIST SURGERY      Home Medications:  Allergies as of 01/16/2018      Reactions   Atorvastatin Other (See Comments)   Leg pain and severe headaches   Demerol [meperidine]    Interacts with other medication, causes blood pressure to raise   Statins Other (See Comments)   Headaches/migraines   Other Swelling, Rash   dermabond      Medication List        Accurate as of 01/16/18  1:30 PM. Always use your most recent med list.          anastrozole 1 MG tablet Commonly known as:  ARIMIDEX Take 1 tablet (1 mg total) by mouth daily.   ARIPiprazole 15 MG tablet Commonly known as:  ABILIFY Take 15 mg by mouth at bedtime. Reported on 09/22/2015   aspirin 81 MG chewable tablet Chew 81 mg by mouth daily.   bumetanide 1 MG tablet Commonly known as:  BUMEX Take 2 mg by mouth daily as needed (diuretic).   CALCIUM PO Take 1,500 mg by mouth daily.   Cholecalciferol 2000 units Tabs Take 2,000 Units by mouth daily. Reported on 02/03/2016  COLACE PO Take 100 mg by mouth 2 (two) times daily.   colchicine 0.6 MG tablet Take 1 mg by mouth as needed (gout flare up). Reported on 09/22/2015   KLONOPIN 1 MG tablet Generic drug:  clonazePAM Take 1 mg by mouth as needed for anxiety.   lamoTRIgine 200 MG tablet Commonly known as:  LAMICTAL Take 200 mg by mouth 2 (two) times daily. Reported on 09/22/2015   lisinopril 10 MG tablet Commonly known as:  PRINIVIL,ZESTRIL Take by mouth. Reported on 09/22/2015   metoprolol succinate 100 MG 24 hr tablet Commonly known as:  TOPROL-XL Take 100 mg by mouth at bedtime.   modafinil 200 MG tablet Commonly known as:  PROVIGIL Take 200 mg by mouth 2 (two) times daily.   multivitamin tablet Take 1 tablet by mouth daily.   oxyCODONE HCl 15 MG Taba Take 15 mg by mouth.   pantoprazole 40 MG tablet Commonly known as:  PROTONIX Take 40 mg by mouth daily.   PREVIDENT  5000 DRY MOUTH 1.1 % Gel dental gel Generic drug:  sodium fluoride See admin instructions.   RELIEF KNEE Misc Use as directed   sildenafil 100 MG tablet Commonly known as:  VIAGRA Take 1 tablet (100 mg total) by mouth as needed for erectile dysfunction.   testosterone cypionate 200 MG/ML injection Commonly known as:  DEPOTESTOSTERONE CYPIONATE Inject 0.7 cc every 10 days.   tranylcypromine 10 MG tablet Commonly known as:  PARNATE Take 30 mg by mouth 3 (three) times daily. Reported on 02/03/2016       Allergies:  Allergies  Allergen Reactions  . Atorvastatin Other (See Comments)    Leg pain and severe headaches  . Demerol [Meperidine]     Interacts with other medication, causes blood pressure to raise  . Statins Other (See Comments)    Headaches/migraines  . Other Swelling and Rash    dermabond    Family History: Family History  Problem Relation Age of Onset  . Arthritis Mother   . Deep vein thrombosis Mother   . Uterine cancer Mother   . Heart disease Mother   . Cirrhosis Sister   . Heart disease Father   . Diabetes Mellitus II Father        Mother, Sister  . Parkinson's disease Father   . Stroke Father   . Kidney disease Neg Hx   . Prostate cancer Neg Hx     Social History:  reports that he has never smoked. He has never used smokeless tobacco. He reports that he drinks about 1.2 oz of alcohol per week. He reports that he does not use drugs.  ROS: UROLOGY Frequent Urination?: No Hard to postpone urination?: No Burning/pain with urination?: No Get up at night to urinate?: No Leakage of urine?: No Urine stream starts and stops?: No Trouble starting stream?: No Do you have to strain to urinate?: No Blood in urine?: No Urinary tract infection?: No Sexually transmitted disease?: No Injury to kidneys or bladder?: No Painful intercourse?: No Weak stream?: No Erection problems?: No Penile pain?: No  Gastrointestinal Nausea?: No Vomiting?:  No Indigestion/heartburn?: No Diarrhea?: No Constipation?: No  Constitutional Fever: No Night sweats?: No Weight loss?: No Fatigue?: No  Skin Skin rash/lesions?: No Itching?: No  Eyes Blurred vision?: No Double vision?: No  Ears/Nose/Throat Sore throat?: No Sinus problems?: No  Hematologic/Lymphatic Swollen glands?: No Easy bruising?: No  Cardiovascular Leg swelling?: Yes Chest pain?: No  Respiratory Cough?: No Shortness of breath?: No  Endocrine  Excessive thirst?: No  Musculoskeletal Back pain?: Yes Joint pain?: Yes  Neurological Headaches?: No Dizziness?: No  Psychologic Depression?: Yes Anxiety?: No  Physical Exam: BP (!) 152/92 (BP Location: Right Arm, Patient Position: Sitting, Cuff Size: Large)   Pulse 92   Ht 6\' 3"  (1.905 m)   Wt (!) 342 lb 9.6 oz (155.4 kg)   SpO2 99%   BMI 42.82 kg/m   Constitutional:  Alert and oriented, No acute distress. HEENT: [place] AT, moist mucus membranes.  Trachea midline, no masses. Cardiovascular: No clubbing, cyanosis, or edema. Respiratory: Normal respiratory effort, no increased work of breathing. GI: Abdomen is soft, nontender, nondistended, no abdominal masses GU: No CVA tenderness Lymph: No cervical or inguinal lymphadenopathy. Skin: No rashes, bruises or suspicious lesions. Neurologic: Grossly intact, no focal deficits, moving all 4 extremities. Psychiatric: Normal mood and affect.  Laboratory Data:   Lab Results  Component Value Date   TESTOSTERONE 473 01/09/2018    Assessment & Plan:   52 year old male with hypogonadism.  Will change his dose to 1 cc every 10 days.  Follow-up 8 weeks for repeat testosterone level, PSA, hematocrit and DRE. Testosterone and anastrozole were refilled.  [name], MD  [facility] [address], [address] [place], [place] [address] [phone]

## 2018-01-18 ENCOUNTER — Encounter (INDEPENDENT_AMBULATORY_CARE_PROVIDER_SITE_OTHER): Payer: Self-pay

## 2018-01-22 ENCOUNTER — Encounter: Payer: Self-pay | Admitting: Podiatry

## 2018-01-29 ENCOUNTER — Encounter: Payer: Self-pay | Admitting: Podiatry

## 2018-01-29 ENCOUNTER — Ambulatory Visit: Payer: BC Managed Care – PPO | Admitting: Podiatry

## 2018-01-29 DIAGNOSIS — B070 Plantar wart: Secondary | ICD-10-CM | POA: Diagnosis not present

## 2018-02-01 NOTE — Progress Notes (Signed)
   Subjective: 52 year old male presenting today for follow up evaluation of a plantar wart located on the left heel. He states he is doing very well and the wart has improved significantly. He denies any pain with walking. Patient is here for further evaluation and treatment.   Past Medical History:  Diagnosis Date  . ADD (attention deficit disorder)   . Anxiety   . Bipolar 1 disorder (HCC)   . BPH (benign prostatic hyperplasia)   . DDD (degenerative disc disease), cervical   . Depression   . Erectile dysfunction   . GERD (gastroesophageal reflux disease)   . Gout   . HLD (hyperlipidemia)   . HTN (hypertension)   . Hypogonadism in male   . Lymphedema   . Nocturia   . Obesity   . Obesity   . Osteoarthritis   . Peripheral neuropathy    feet  . S/P insertion of spinal cord stimulator   . Sleep apnea    CPAP  . Tinea cruris     Objective: Physical Exam General: The patient is alert and oriented x3 in no acute distress.  Dermatology: Hyperkeratotic skin lesion noted to the plantar aspect of the left foot approximately 1 cm in diameter. Pinpoint bleeding noted upon debridement. Skin is warm, dry and supple bilateral lower extremities. Negative for open lesions or macerations.  Vascular: Palpable pedal pulses bilaterally. No edema or erythema noted. Capillary refill within normal limits.  Neurological: Epicritic and protective threshold grossly intact bilaterally.   Musculoskeletal Exam: Pain on palpation to the note skin lesion.  Range of motion within normal limits to all pedal and ankle joints bilateral. Muscle strength 5/5 in all groups bilateral.   Assessment: #1 plantar wart left heel - improved    Plan of Care:  #1 Patient was evaluated. #2 Excisional debridement of the plantar wart lesion was performed using a chisel blade. Salinocaine was applied and the lesion was dressed with a dry sterile dressing. #3 patient is to return to clinic as needed.   Felecia ShellingBrent  M. Fonnie Crookshanks, DPM [facility]  Dr. Felecia ShellingBrent M. Kenneth Cuaresma, DPM    [address]                                        [place], [place] [address]                Office [phone]  Fax [phone]

## 2018-02-26 ENCOUNTER — Ambulatory Visit (INDEPENDENT_AMBULATORY_CARE_PROVIDER_SITE_OTHER): Payer: BC Managed Care – PPO | Admitting: Vascular Surgery

## 2018-02-26 ENCOUNTER — Encounter

## 2018-02-26 ENCOUNTER — Encounter (INDEPENDENT_AMBULATORY_CARE_PROVIDER_SITE_OTHER): Payer: Self-pay | Admitting: Vascular Surgery

## 2018-02-26 VITALS — BP 130/75 | HR 91 | Resp 14 | Ht 75.0 in | Wt 342.0 lb

## 2018-02-26 DIAGNOSIS — I872 Venous insufficiency (chronic) (peripheral): Secondary | ICD-10-CM | POA: Diagnosis not present

## 2018-02-26 NOTE — Progress Notes (Signed)
Chronic venous insufficiency     The patient's left lower extremity was sterilely prepped and draped. The ultrasound machine was used to visualize the saphenous vein throughout its course. A segment in the mid to upper calf was selected for access. The saphenous vein was accessed without difficulty using ultrasound guidance with a micro puncture needle. A micro puncture wire and sheath were then placed. A 0.018 wire was placed beyond the saphenofemoral junction through the sheath and the micro puncture sheath was removed. The 65 cm sheath was then placed over the wire and the wire and dilator were removed. The laser fiber was placed through the sheath and its tip was placed approximately 5 cm below the saphenofemoral junction. Tumescent anesthesia was then created with a dilute lidocaine solution. Laser energy was then delivered with constant withdrawal of the sheath and laser fiber. Approximately 1517 Joules of energy were delivered over a length of 44 cm using the 1470 Hz [employer] machine at [employer]. Sterile dressings were placed. The patient tolerated the procedure well without complications.

## 2018-03-01 ENCOUNTER — Ambulatory Visit (INDEPENDENT_AMBULATORY_CARE_PROVIDER_SITE_OTHER): Payer: BC Managed Care – PPO

## 2018-03-01 DIAGNOSIS — I872 Venous insufficiency (chronic) (peripheral): Secondary | ICD-10-CM

## 2018-03-18 ENCOUNTER — Other Ambulatory Visit: Payer: Self-pay

## 2018-03-18 ENCOUNTER — Other Ambulatory Visit: Payer: BC Managed Care – PPO

## 2018-03-18 DIAGNOSIS — E291 Testicular hypofunction: Secondary | ICD-10-CM

## 2018-03-19 LAB — TESTOSTERONE: Testosterone: 534 ng/dL (ref 264–916)

## 2018-03-19 LAB — HEMATOCRIT: Hematocrit: 41.7 % (ref 37.5–51.0)

## 2018-03-19 LAB — PSA: PROSTATE SPECIFIC AG, SERUM: 0.7 ng/mL (ref 0.0–4.0)

## 2018-03-25 ENCOUNTER — Ambulatory Visit: Payer: BC Managed Care – PPO | Admitting: Urology

## 2018-03-25 ENCOUNTER — Encounter: Payer: Self-pay | Admitting: Urology

## 2018-03-25 VITALS — BP 145/88 | HR 89 | Ht 75.0 in | Wt 342.0 lb

## 2018-03-25 DIAGNOSIS — E291 Testicular hypofunction: Secondary | ICD-10-CM

## 2018-03-25 NOTE — Progress Notes (Signed)
03/25/2018 10:40 AM   [name] [birth year] [id number]  Referring provider: Rolm Gala, MD [address] [place], [place] [address]  Chief Complaint  Patient presents with  . Hypogonadism    2 month    HPI: 52 year old male presents for follow-up of hypogonadism.  He is injecting 100 mg testosterone cypionate every 10 days.  He states he is doing well.  He has good energy level and libido.  Labs drawn on 8/5 remarkable for a testosterone level of 534/PSA 0.7/hematocrit 41.7.  He uses sildenafil prn erectile dysfunction.  He is also on anastrozole which was started by another provider.   PMH: Past Medical History:  Diagnosis Date  . ADD (attention deficit disorder)   . Anxiety   . Bipolar 1 disorder (HCC)   . BPH (benign prostatic hyperplasia)   . DDD (degenerative disc disease), cervical   . Depression   . Erectile dysfunction   . GERD (gastroesophageal reflux disease)   . Gout   . HLD (hyperlipidemia)   . HTN (hypertension)   . Hypogonadism in male   . Lymphedema   . Nocturia   . Obesity   . Obesity   . Osteoarthritis   . Peripheral neuropathy    feet  . S/P insertion of spinal cord stimulator   . Sleep apnea    CPAP  . Tinea cruris     Surgical History: Past Surgical History:  Procedure Laterality Date  . BACK SURGERY    . CARDIAC CATHETERIZATION    . COLONOSCOPY WITH PROPOFOL N/A 06/25/2017   Procedure: COLONOSCOPY WITH PROPOFOL;  Surgeon: Midge Minium, MD;  Location[facility];  Service: Endoscopy;  Laterality: N/A;  sleep apnea  . JOINT REPLACEMENT     x 3  . LUMBAR DISC SURGERY     x 3  . POLYPECTOMY N/A 06/25/2017   Procedure: POLYPECTOMY;  Surgeon: Midge Minium, MD;  [facility];  Service: Endoscopy;  Laterality: N/A;  . STOMACH SURGERY     gastric sleeve  . TOE SURGERY    . TOTAL KNEE ARTHROPLASTY Left 2014  . UMBILICAL HERNIA REPAIR N/A 02/17/2016   Procedure: HERNIA REPAIR UMBILICAL ADULT;  Surgeon:  Lattie Haw, MD;  Location: [facility];  Service: General;  Laterality: N/A;  . WRIST SURGERY      Home Medications:  Allergies as of 03/25/2018      Reactions   Atorvastatin Other (See Comments)   Leg pain and severe headaches   Demerol [meperidine]    Interacts with other medication, causes blood pressure to raise   Statins Other (See Comments)   Headaches/migraines   Other Swelling, Rash   dermabond      Medication List        Accurate as of 03/25/18 10:40 AM. Always use your most recent med list.          ALPRAZolam 0.5 MG tablet Commonly known as:  XANAX   anastrozole 1 MG tablet Commonly known as:  ARIMIDEX Take 1 tablet (1 mg total) by mouth daily.   ARIPiprazole 15 MG tablet Commonly known as:  ABILIFY Take 15 mg by mouth at bedtime. Reported on 09/22/2015   aspirin 81 MG chewable tablet Chew 81 mg by mouth daily.   bumetanide 1 MG tablet Commonly known as:  BUMEX Take 2 mg by mouth daily as needed (diuretic).   CALCIUM PO Take 1,500 mg by mouth daily.   Cholecalciferol 2000 units Tabs Take 2,000 Units by  mouth daily. Reported on 02/03/2016   COLACE PO Take 100 mg by mouth 2 (two) times daily.   colchicine 0.6 MG tablet Take 1 mg by mouth as needed (gout flare up). Reported on 09/22/2015   hydrocortisone 2.5 % cream APPLY TO AFFECTED AREA TWICE A DAY   KLONOPIN 1 MG tablet Generic drug:  clonazePAM Take 1 mg by mouth as needed for anxiety.   lamoTRIgine 200 MG tablet Commonly known as:  LAMICTAL Take 200 mg by mouth 2 (two) times daily. Reported on 09/22/2015   lisinopril 20 MG tablet Commonly known as:  PRINIVIL,ZESTRIL Take 20 mg by mouth daily.   metoprolol succinate 100 MG 24 hr tablet Commonly known as:  TOPROL-XL Take 100 mg by mouth at bedtime.   modafinil 200 MG tablet Commonly known as:  PROVIGIL Take 200 mg by mouth 2 (two) times daily.   multivitamin tablet Take 1 tablet by mouth daily.   oxyCODONE HCl 15 MG Taba Take 15  mg by mouth.   pantoprazole 40 MG tablet Commonly known as:  PROTONIX Take 40 mg by mouth daily.   PREVIDENT 5000 DRY MOUTH 1.1 % Gel dental gel Generic drug:  sodium fluoride See admin instructions.   RELIEF KNEE Misc Use as directed   sildenafil 100 MG tablet Commonly known as:  VIAGRA Take 1 tablet (100 mg total) by mouth as needed for erectile dysfunction.   testosterone cypionate 200 MG/ML injection Commonly known as:  DEPOTESTOSTERONE CYPIONATE Inject 1.0 cc every 10 days.   tranylcypromine 10 MG tablet Commonly known as:  PARNATE Take 30 mg by mouth 3 (three) times daily. Reported on 02/03/2016       Allergies:  Allergies  Allergen Reactions  . Atorvastatin Other (See Comments)    Leg pain and severe headaches  . Demerol [Meperidine]     Interacts with other medication, causes blood pressure to raise  . Statins Other (See Comments)    Headaches/migraines  . Other Swelling and Rash    dermabond    Family History: Family History  Problem Relation Age of Onset  . Arthritis Mother   . Deep vein thrombosis Mother   . Uterine cancer Mother   . Heart disease Mother   . Cirrhosis Sister   . Heart disease Father   . Diabetes Mellitus II Father        Mother, Sister  . Parkinson's disease Father   . Stroke Father   . Kidney disease Neg Hx   . Prostate cancer Neg Hx     Social History:  reports that he has never smoked. He has never used smokeless tobacco. He reports that he drinks about 2.0 standard drinks of alcohol per week. He reports that he does not use drugs.  ROS: UROLOGY Frequent Urination?: No Hard to postpone urination?: No Burning/pain with urination?: No Get up at night to urinate?: No Leakage of urine?: No Urine stream starts and stops?: No Trouble starting stream?: No Do you have to strain to urinate?: No Blood in urine?: No Urinary tract infection?: No Sexually transmitted disease?: No Injury to kidneys or bladder?: No Painful  intercourse?: No Weak stream?: No Erection problems?: No Penile pain?: No  Gastrointestinal Nausea?: No Vomiting?: No Indigestion/heartburn?: No Diarrhea?: No Constipation?: No  Constitutional Fever: No Night sweats?: No Weight loss?: No Fatigue?: No  Skin Skin rash/lesions?: No Itching?: No  Eyes Blurred vision?: No Double vision?: No  Ears/Nose/Throat Sore throat?: No Sinus problems?: No  Hematologic/Lymphatic Swollen glands?: No  Easy bruising?: No  Cardiovascular Leg swelling?: No Chest pain?: No  Respiratory Cough?: No Shortness of breath?: No  Endocrine Excessive thirst?: No  Musculoskeletal Back pain?: Yes Joint pain?: Yes  Neurological Headaches?: No Dizziness?: No  Psychologic Depression?: Yes Anxiety?: No  Physical Exam: BP (!) 145/88   Pulse 89   Ht 6\' 3"  (1.905 m)   Wt (!) 342 lb (155.1 kg)   BMI 42.75 kg/m   Constitutional:  Alert and oriented, No acute distress. HEENT: [place] AT, moist mucus membranes.  Trachea midline, no masses. Cardiovascular: No clubbing, cyanosis, or edema. Respiratory: Normal respiratory effort, no increased work of breathing. GI: Abdomen is soft, nontender, nondistended, no abdominal masses GU: No CVA tenderness.  Prostate 25 g, smooth without nodules Lymph: No cervical or inguinal lymphadenopathy. Skin: No rashes, bruises or suspicious lesions. Neurologic: Grossly intact, no focal deficits, moving all 4 extremities. Psychiatric: Normal mood and affect.   Assessment & Plan:   52 year old male with hypogonadism and doing well on TRT.  Follow-up 6 months for a testosterone level/hematocrit in 1 year for office visit with DRE and testosterone/PSA/hematocrit.  Return in about 1 year (around 03/26/2019) for Recheck, labs.   [name] Stoioff, MD  [facility] [address], [address] [place], [place] [address] [phone]

## 2018-03-26 ENCOUNTER — Other Ambulatory Visit: Payer: Self-pay | Admitting: Urology

## 2018-03-27 ENCOUNTER — Encounter: Payer: Self-pay | Admitting: Urology

## 2018-03-27 MED ORDER — TESTOSTERONE CYPIONATE 200 MG/ML IM SOLN: INTRAMUSCULAR | 0 refills | Status: DC

## 2018-03-28 ENCOUNTER — Other Ambulatory Visit: Payer: Self-pay | Admitting: Urology

## 2018-03-28 MED ORDER — TESTOSTERONE CYPIONATE 200 MG/ML IM SOLN: INTRAMUSCULAR | 0 refills | Status: DC

## 2018-04-17 ENCOUNTER — Telehealth: Payer: Self-pay | Admitting: Urology

## 2018-04-17 ENCOUNTER — Encounter: Payer: Self-pay | Admitting: Urology

## 2018-04-17 NOTE — Telephone Encounter (Signed)
Patient wants his testosterone transferred to the [facility] in [place]. He states the current pharmacy is unable to supply it any longer. Thanks, Marcelino Duster

## 2018-04-18 MED ORDER — TESTOSTERONE CYPIONATE 200 MG/ML IM SOLN: INTRAMUSCULAR | 0 refills | Status: DC

## 2018-04-18 NOTE — Telephone Encounter (Signed)
rx sent

## 2018-05-08 ENCOUNTER — Encounter (INDEPENDENT_AMBULATORY_CARE_PROVIDER_SITE_OTHER): Payer: Self-pay

## 2018-05-09 NOTE — Telephone Encounter (Signed)
Hi Rodney Howard,Based on the picture that you sent as well as the description that you are giving it does not appear to be or sound to be a superficial thrombophlebitis.  Typically a superficial thrombophlebitis would not rupture and then continue to ooze and bleed.  Based on the picture you sent it certainly does not appear to be infected.  I would contact your primary care provider for further examination and management of the area.

## 2018-06-06 ENCOUNTER — Encounter: Payer: Self-pay | Admitting: Urology

## 2018-06-06 ENCOUNTER — Other Ambulatory Visit: Payer: Self-pay | Admitting: Urology

## 2018-06-12 ENCOUNTER — Encounter: Payer: Self-pay | Admitting: Urology

## 2018-06-12 ENCOUNTER — Other Ambulatory Visit: Payer: Self-pay | Admitting: Urology

## 2018-06-12 MED ORDER — TESTOSTERONE CYPIONATE 200 MG/ML IM SOLN: INTRAMUSCULAR | 0 refills | Status: DC

## 2018-07-20 ENCOUNTER — Encounter: Payer: Self-pay | Admitting: Urology

## 2018-07-22 ENCOUNTER — Other Ambulatory Visit: Payer: Self-pay | Admitting: Urology

## 2018-07-22 MED ORDER — ANASTROZOLE 1 MG PO TABS: 1.0000 mg | ORAL_TABLET | Freq: Every day | ORAL | 3 refills | Status: DC

## 2018-08-15 DIAGNOSIS — K641 Second degree hemorrhoids: Secondary | ICD-10-CM | POA: Insufficient documentation

## 2018-08-15 DIAGNOSIS — Z79891 Long term (current) use of opiate analgesic: Secondary | ICD-10-CM | POA: Insufficient documentation

## 2018-08-15 DIAGNOSIS — K5909 Other constipation: Secondary | ICD-10-CM | POA: Insufficient documentation

## 2018-09-24 ENCOUNTER — Other Ambulatory Visit: Payer: BC Managed Care – PPO | Admitting: Urology

## 2018-09-24 ENCOUNTER — Other Ambulatory Visit: Payer: BC Managed Care – PPO

## 2018-09-24 DIAGNOSIS — E291 Testicular hypofunction: Secondary | ICD-10-CM

## 2018-09-26 ENCOUNTER — Telehealth: Payer: Self-pay

## 2018-09-26 LAB — HEMATOCRIT: HEMATOCRIT: 44.3 % (ref 37.5–51.0)

## 2018-09-26 LAB — TESTOSTERONE: TESTOSTERONE: 649 ng/dL (ref 264–916)

## 2018-09-26 NOTE — Telephone Encounter (Signed)
-----   Message from [name], MD sent at 09/26/2018  2:38 PM EST ----- Testosterone level looks good at 649.  Hematocrit was normal at 44.3.  Keep scheduled follow-up in August

## 2018-10-04 ENCOUNTER — Other Ambulatory Visit: Payer: Self-pay | Admitting: Urology

## 2018-10-04 MED ORDER — TESTOSTERONE CYPIONATE 200 MG/ML IM SOLN: INTRAMUSCULAR | 0 refills | Status: DC

## 2018-10-15 ENCOUNTER — Other Ambulatory Visit (INDEPENDENT_AMBULATORY_CARE_PROVIDER_SITE_OTHER): Payer: Self-pay | Admitting: Nurse Practitioner

## 2018-10-15 ENCOUNTER — Encounter (INDEPENDENT_AMBULATORY_CARE_PROVIDER_SITE_OTHER): Payer: Self-pay

## 2018-10-15 ENCOUNTER — Telehealth (INDEPENDENT_AMBULATORY_CARE_PROVIDER_SITE_OTHER): Payer: Self-pay | Admitting: Nurse Practitioner

## 2018-10-15 DIAGNOSIS — I83891 Varicose veins of right lower extremities with other complications: Secondary | ICD-10-CM

## 2018-10-16 ENCOUNTER — Ambulatory Visit (INDEPENDENT_AMBULATORY_CARE_PROVIDER_SITE_OTHER): Payer: BC Managed Care – PPO | Admitting: Nurse Practitioner

## 2018-10-16 ENCOUNTER — Other Ambulatory Visit: Payer: Self-pay

## 2018-10-16 ENCOUNTER — Ambulatory Visit (INDEPENDENT_AMBULATORY_CARE_PROVIDER_SITE_OTHER): Payer: BC Managed Care – PPO

## 2018-10-16 ENCOUNTER — Encounter (INDEPENDENT_AMBULATORY_CARE_PROVIDER_SITE_OTHER): Payer: Self-pay | Admitting: Nurse Practitioner

## 2018-10-16 ENCOUNTER — Other Ambulatory Visit (INDEPENDENT_AMBULATORY_CARE_PROVIDER_SITE_OTHER): Payer: Self-pay | Admitting: Nurse Practitioner

## 2018-10-16 VITALS — BP 128/80 | HR 80 | Resp 12 | Ht 75.0 in | Wt 337.0 lb

## 2018-10-16 DIAGNOSIS — I83893 Varicose veins of bilateral lower extremities with other complications: Secondary | ICD-10-CM | POA: Diagnosis not present

## 2018-10-16 DIAGNOSIS — Z7982 Long term (current) use of aspirin: Secondary | ICD-10-CM

## 2018-10-16 DIAGNOSIS — I83891 Varicose veins of right lower extremities with other complications: Secondary | ICD-10-CM | POA: Diagnosis not present

## 2018-10-16 DIAGNOSIS — Z79899 Other long term (current) drug therapy: Secondary | ICD-10-CM

## 2018-10-16 DIAGNOSIS — I8001 Phlebitis and thrombophlebitis of superficial vessels of right lower extremity: Secondary | ICD-10-CM

## 2018-10-16 DIAGNOSIS — I10 Essential (primary) hypertension: Secondary | ICD-10-CM | POA: Diagnosis not present

## 2018-10-16 DIAGNOSIS — I809 Phlebitis and thrombophlebitis of unspecified site: Secondary | ICD-10-CM | POA: Insufficient documentation

## 2018-10-16 NOTE — Progress Notes (Signed)
SUBJECTIVE:  Patient ID: Rodney Howard, male    DOB: [birth year], 53 y.o.   MRN: [id number] Chief Complaint  Patient presents with  . Follow-up    HPI  Rodney Howard is a 53 y.o. male that presents today with complaints about the varicose veins on his right lower extremity.  The patient previously underwent a endovenous laser ablation on his left lower extremity on 02/26/2018.  At that time the patient had multiple venous ulcers and multiple bouts of cellulitis.  Today they have all healed.  Since that time he has continued to wear medical grade 1 compression stockings on a daily basis.  He continues to elevate his extremities when possible and exercise at least 30 minutes a day.  However despite these conservative therapies the patient noticed that he began to have more burning stinging and aching of his right lower extremity.The patient continues to have pain in the right lower extremities with dependency. The pain is lessened with elevation. Graduated compression stockings, Class I (20-30 mmHg), have been worn but the stockings do not eliminate the leg pain. Over-the-counter analgesics do not improve the symptoms. The degree of discomfort continues to interfere with daily activities. The patient notes the pain in the legs is causing problems with daily exercise, at the workplace and even with household activities and maintenance such as standing in the kitchen preparing meals and doing dishes.   Venous ultrasound shows normal deep venous system, no evidence of acute or chronic DVT.  Superficial reflux is present in the right great saphenous vein from the proximal to mid thigh and knee to calf levels.  There is no evidence of DVT of the right lower extremity.  There was an incidental finding of a superficial thrombo-phlebitis noticed in a mid thigh varicosity that is not the great saphenous vein.  Past Medical History:  Diagnosis Date  . ADD (attention deficit disorder)   . Anxiety   .  Bipolar 1 disorder (HCC)   . BPH (benign prostatic hyperplasia)   . DDD (degenerative disc disease), cervical   . Depression   . Erectile dysfunction   . GERD (gastroesophageal reflux disease)   . Gout   . HLD (hyperlipidemia)   . HTN (hypertension)   . Hypogonadism in male   . Lymphedema   . Nocturia   . Obesity   . Obesity   . Osteoarthritis   . Peripheral neuropathy    feet  . S/P insertion of spinal cord stimulator   . Sleep apnea    CPAP  . Tinea cruris     Past Surgical History:  Procedure Laterality Date  . BACK SURGERY    . CARDIAC CATHETERIZATION    . COLONOSCOPY WITH PROPOFOL N/A 06/25/2017   Procedure: COLONOSCOPY WITH PROPOFOL;  Surgeon: Midge Minium, MD;  Location: [facility];  Service: Endoscopy;  Laterality: N/A;  sleep apnea  . JOINT REPLACEMENT     x 3  . LUMBAR DISC SURGERY     x 3  . POLYPECTOMY N/A 06/25/2017   Procedure: POLYPECTOMY;  Surgeon: Midge Minium, MD;  Location: [facility];  Service: Endoscopy;  Laterality: N/A;  . STOMACH SURGERY     gastric sleeve  . TOE SURGERY    . TOTAL KNEE ARTHROPLASTY Left 2014  . UMBILICAL HERNIA REPAIR N/A 02/17/2016   Procedure: HERNIA REPAIR UMBILICAL ADULT;  Surgeon: Lattie Haw, MD;  Location: [facility];  Service: General;  Laterality: N/A;  . WRIST SURGERY  Social History   Socioeconomic History  . Marital status: Married    Spouse name: Not on file  . Number of children: Not on file  . Years of education: Not on file  . Highest education level: Not on file  Occupational History  . Not on file  Social Needs  . Financial resource strain: Not on file  . Food insecurity:    Worry: Not on file    Inability: Not on file  . Transportation needs:    Medical: Not on file    Non-medical: Not on file  Tobacco Use  . Smoking status: Never Smoker  . Smokeless tobacco: Never Used  Substance and Sexual Activity  . Alcohol use: Yes    Alcohol/week: 2.0 standard drinks    Types:  2 Cans of beer per week    Comment: occasional  . Drug use: No  . Sexual activity: Yes  Lifestyle  . Physical activity:    Days per week: Not on file    Minutes per session: Not on file  . Stress: Not on file  Relationships  . Social connections:    Talks on phone: Not on file    Gets together: Not on file    Attends religious service: Not on file    Active member of club or organization: Not on file    Attends meetings of clubs or organizations: Not on file    Relationship status: Not on file  . Intimate partner violence:    Fear of current or ex partner: Not on file    Emotionally abused: Not on file    Physically abused: Not on file    Forced sexual activity: Not on file  Other Topics Concern  . Not on file  Social History Narrative  . Not on file    Family History  Problem Relation Age of Onset  . Arthritis Mother   . Deep vein thrombosis Mother   . Uterine cancer Mother   . Heart disease Mother   . Cirrhosis Sister   . Heart disease Father   . Diabetes Mellitus II Father        Mother, Sister  . Parkinson's disease Father   . Stroke Father   . Kidney disease Neg Hx   . Prostate cancer Neg Hx     Allergies  Allergen Reactions  . Atorvastatin Other (See Comments)    Leg pain and severe headaches  . Demerol [Meperidine]     Interacts with other medication, causes blood pressure to raise  . Statins Other (See Comments)    Headaches/migraines  . Other Swelling and Rash    dermabond     Review of Systems   Review of Systems: Negative Unless Checked Constitutional: Weight loss  Fever  Chills Cardiac: Chest pain    Atrial Fibrillation  Palpitations   Shortness of breath when laying flat   Shortness of breath with exertion. Shortness of breath at rest Vascular:  Pain in legs with walking   Pain in legs with standing Pain in legs when laying flat   Claudication    Pain in feet when laying flat    History of DVT   Phlebitis    Swelling in legs   Varicose veins   Non-healing ulcers Pulmonary:   Uses home oxygen   Productive cough   Hemoptysis   Wheeze  COPD   Asthma Neurologic:  Dizziness   Seizures  Blackouts History of stroke   History of TIA  Aphasia     Temporary Blindness   [] Weakness or numbness in arm   [] Weakness or numbness in leg Musculoskeletal:   [] Joint swelling   [] Joint pain   [] Low back pain  []  History of Knee Replacement [] Arthritis [] back Surgeries  []  Spinal Stenosis    Hematologic:  [] Easy bruising  [] Easy bleeding   [] Hypercoagulable state   [] Anemic Gastrointestinal:  [] Diarrhea   [] Vomiting  [] Gastroesophageal reflux/heartburn   [] Difficulty swallowing. [] Abdominal pain Genitourinary:  [] Chronic kidney disease   [] Difficult urination  [] Anuric   [] Blood in urine [] Frequent urination  [] Burning with urination   [] Hematuria Skin:  [x] Rashes   [] Ulcers [] Wounds Psychological:  [] History of anxiety   []  History of major depression  []  Memory Difficulties      OBJECTIVE:   Physical Exam  BP 128/80 (BP Location: Left Arm, Patient Position: Sitting, Cuff Size: Large)   Pulse 80   Resp 12   Ht 6\' 3"  (1.905 m)   Wt (!) 337 lb (152.9 kg)   BMI 42.12 kg/m   Gen: WD/WN, NAD Head: [place]/AT, No temporalis wasting.  Ear/Nose/Throat: Hearing grossly intact, nares w/o erythema or drainage Eyes: PER, EOMI, sclera nonicteric.  Neck: Supple, no masses.  No JVD.  Pulmonary:  Good air movement, no use of accessory muscles.  Cardiac: RRR Vascular: 1+ edema of right lower extremity with multiple scattered moderate sized varicosities. Vessel Right Left  Radial Palpable Palpable  Dorsalis Pedis Palpable Palpable  Posterior Tibial Palpable Palpable   Gastrointestinal: soft, non-distended. No guarding/no peritoneal signs.  Musculoskeletal: M/S 5/5 throughout.  No deformity or atrophy.  Neurologic: Pain and light touch intact in extremities.  Symmetrical.  Speech is fluent.  Motor exam as listed above. Psychiatric: Judgment intact, Mood & affect appropriate for pt's clinical situation. Dermatologic: Stasis dermatitis bilaterally. No Ulcers Noted.  No changes consistent with cellulitis. Lymph : No Cervical lymphadenopathy, no lichenification or skin changes of chronic lymphedema.       ASSESSMENT AND PLAN:  1. Varicose veins of bilateral lower extremities with other complications Venous ultrasound shows normal deep venous system, no evidence of acute or chronic DVT.  Superficial reflux is present in the right great saphenous vein from the proximal to mid thigh and knee to calf levels.  There is no evidence of DVT of the right lower extremity.  There was an incidental finding of a superficial thrombo-phlebitis noticed in a mid thigh varicosity that is not the great saphenous vein.  Recommend  I have reviewed my previous  discussion with the patient regarding  varicose veins and why they cause symptoms. Patient will continue  wearing graduated compression stockings class 1 on a daily basis, beginning first thing in the morning and removing them in the evening.    In addition, behavioral modification including elevation during the day was again discussed and this will continue.  The patient has utilized over the counter pain medications and has been exercising.  However, at this time conservative therapy has not alleviated the patient's symptoms of leg pain and swelling  Recommend: laser ablation of the right great saphenous veins to eliminate the symptoms of pain and swelling of the lower extremities caused by the severe superficial venous reflux disease.   2. Essential (primary) hypertension Continue antihypertensive medications as already ordered, these medications have been reviewed and there are no changes at this time.   3. 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     Current Outpatient Medications on File Prior to Visit  Medication Sig Dispense Refill  . anastrozole (ARIMIDEX) 1 MG tablet Take 1 tablet (1 mg total) by mouth daily. 90 tablet 3  . ARIPiprazole (ABILIFY) 15 MG tablet Take 15 mg by mouth at bedtime. Reported on 09/22/2015    . aspirin 81 MG chewable tablet Chew 81 mg by mouth daily.     . bumetanide (BUMEX) 1 MG tablet Take 2 mg by mouth daily as needed (diuretic).     . CALCIUM PO Take 1,500 mg by mouth daily.    . Cholecalciferol 2000 UNITS TABS Take 2,000 Units by mouth daily. Reported on 02/03/2016    . clonazePAM (KLONOPIN) 1 MG tablet Take 1 mg by mouth as needed for anxiety.    . colchicine 0.6 MG tablet Take 1 mg by mouth as needed (gout flare up). Reported on 09/22/2015  3  . Docusate Sodium (COLACE PO) Take 100 mg by mouth 2 (two) times daily.     [employer] Bandages & Supports (RELIEF KNEE) MISC Use as directed    . hydrocortisone 2.5 % cream APPLY TO AFFECTED AREA TWICE A DAY  12  . lamoTRIgine (LAMICTAL) 200 MG tablet Take 200 mg by mouth 2 (two) times daily. Reported on 09/22/2015    . lisinopril (PRINIVIL,ZESTRIL) 20 MG tablet Take 20 mg by mouth daily.  3  . metoprolol succinate (TOPROL-XL) 100 MG 24 hr tablet Take 100 mg by mouth at bedtime.     . modafinil (PROVIGIL) 200 MG tablet Take 200 mg by mouth 2 (two) times daily.     . Multiple Vitamin (MULTIVITAMIN) tablet Take 1 tablet by mouth daily.    Marland Kitchen oxyCODONE HCl 15 MG TABA Take 15 mg by mouth.    . pantoprazole (PROTONIX) 40 MG tablet Take 40 mg by mouth daily.    Marland Kitchen PREVIDENT 5000 DRY MOUTH 1.1 % GEL dental gel See admin instructions.  5  . sildenafil (VIAGRA) 100 MG tablet Take 1 tablet (100 mg total) by mouth as needed for erectile dysfunction. 88 tablet 3  . testosterone cypionate (DEPOTESTOSTERONE CYPIONATE) 200 MG/ML injection Inject 1.0 cc every 10 days. 10 mL 0  . tranylcypromine (PARNATE) 10 MG tablet Take 30 mg by mouth 3 (three) times daily.  Reported on 02/03/2016    . ALPRAZolam (XANAX) 0.5 MG tablet      No current facility-administered medications on file prior to visit.     There are no Patient Instructions on file for this visit. No follow-ups on file.   Georgiana Spinner, NP  This note was completed with Office manager.  Any errors are purely unintentional.

## 2018-10-22 ENCOUNTER — Encounter (INDEPENDENT_AMBULATORY_CARE_PROVIDER_SITE_OTHER): Payer: Self-pay

## 2018-11-07 ENCOUNTER — Encounter: Payer: Self-pay | Admitting: Urology

## 2018-11-12 ENCOUNTER — Other Ambulatory Visit: Payer: Self-pay | Admitting: Urology

## 2018-11-12 MED ORDER — TESTOSTERONE CYPIONATE 200 MG/ML IM SOLN: 200.0000 mg | INTRAMUSCULAR | 0 refills | Status: DC

## 2018-11-28 NOTE — Telephone Encounter (Signed)
Patient contacted by front office

## 2018-12-02 DIAGNOSIS — T85192A Other mechanical complication of implanted electronic neurostimulator (electrode) of spinal cord, initial encounter: Secondary | ICD-10-CM | POA: Insufficient documentation

## 2018-12-06 ENCOUNTER — Other Ambulatory Visit (INDEPENDENT_AMBULATORY_CARE_PROVIDER_SITE_OTHER): Payer: BC Managed Care – PPO | Admitting: Vascular Surgery

## 2018-12-09 ENCOUNTER — Encounter (INDEPENDENT_AMBULATORY_CARE_PROVIDER_SITE_OTHER): Payer: BC Managed Care – PPO

## 2018-12-19 ENCOUNTER — Other Ambulatory Visit: Payer: Self-pay

## 2018-12-19 DIAGNOSIS — E291 Testicular hypofunction: Secondary | ICD-10-CM

## 2018-12-23 ENCOUNTER — Other Ambulatory Visit: Payer: BC Managed Care – PPO

## 2018-12-23 ENCOUNTER — Other Ambulatory Visit
Admission: RE | Admit: 2018-12-23 | Discharge: 2018-12-23 | Disposition: A | Discharge status: Home / Self Care | Payer: BC Managed Care – PPO | Source: Ambulatory Visit | Attending: Urology | Admitting: Urology

## 2018-12-23 ENCOUNTER — Other Ambulatory Visit: Payer: Self-pay

## 2018-12-23 DIAGNOSIS — E291 Testicular hypofunction: Secondary | ICD-10-CM

## 2018-12-24 ENCOUNTER — Encounter (INDEPENDENT_AMBULATORY_CARE_PROVIDER_SITE_OTHER): Payer: Self-pay | Admitting: Vascular Surgery

## 2018-12-24 ENCOUNTER — Other Ambulatory Visit: Payer: Self-pay | Admitting: Urology

## 2018-12-24 ENCOUNTER — Ambulatory Visit (INDEPENDENT_AMBULATORY_CARE_PROVIDER_SITE_OTHER): Payer: BC Managed Care – PPO | Admitting: Vascular Surgery

## 2018-12-24 ENCOUNTER — Encounter: Payer: Self-pay | Admitting: Urology

## 2018-12-24 ENCOUNTER — Other Ambulatory Visit: Payer: Self-pay

## 2018-12-24 VITALS — BP 136/80 | HR 86 | Resp 12 | Ht 75.0 in | Wt 337.0 lb

## 2018-12-24 DIAGNOSIS — I83811 Varicose veins of right lower extremities with pain: Secondary | ICD-10-CM

## 2018-12-24 LAB — TESTOSTERONE: Testosterone: 954 ng/dL — ABNORMAL HIGH (ref 264–916)

## 2018-12-24 MED ORDER — TESTOSTERONE CYPIONATE 200 MG/ML IM SOLN: 200.0000 mg | INTRAMUSCULAR | 0 refills | Status: DC

## 2018-12-24 NOTE — Progress Notes (Signed)
Rodney Howard is a 53 y.o. male who presents with symptomatic venous reflux  Past Medical History:  Diagnosis Date  . ADD (attention deficit disorder)   . Anxiety   . Bipolar 1 disorder (HCC)   . BPH (benign prostatic hyperplasia)   . DDD (degenerative disc disease), cervical   . Depression   . Erectile dysfunction   . GERD (gastroesophageal reflux disease)   . Gout   . HLD (hyperlipidemia)   . HTN (hypertension)   . Hypogonadism in male   . Lymphedema   . Nocturia   . Obesity   . Obesity   . Osteoarthritis   . Peripheral neuropathy    feet  . S/P insertion of spinal cord stimulator   . Sleep apnea    CPAP  . Tinea cruris     Past Surgical History:  Procedure Laterality Date  . BACK SURGERY    . CARDIAC CATHETERIZATION    . COLONOSCOPY WITH PROPOFOL N/A 06/25/2017   Procedure: COLONOSCOPY WITH PROPOFOL;  Surgeon: Midge Minium, MD;  Location: [facility];  Service: Endoscopy;  Laterality: N/A;  sleep apnea  . JOINT REPLACEMENT     x 3  . LUMBAR DISC SURGERY     x 3  . POLYPECTOMY N/A 06/25/2017   Procedure: POLYPECTOMY;  Surgeon: Midge Minium, MD;  Location: [facility];  Service: Endoscopy;  Laterality: N/A;  . STOMACH SURGERY     gastric sleeve  . TOE SURGERY    . TOTAL KNEE ARTHROPLASTY Left 2014  . UMBILICAL HERNIA REPAIR N/A 02/17/2016   Procedure: HERNIA REPAIR UMBILICAL ADULT;  Surgeon: Lattie Haw, MD;  Location: [facility];  Service: General;  Laterality: N/A;  . WRIST SURGERY       Current Outpatient Medications:  .  ALPRAZolam (XANAX) 0.5 MG tablet, , Disp: , Rfl:  .  anastrozole (ARIMIDEX) 1 MG tablet, Take 1 tablet (1 mg total) by mouth daily., Disp: 90 tablet, Rfl: 3 .  ARIPiprazole (ABILIFY) 15 MG tablet, Take 15 mg by mouth at bedtime. Reported on 09/22/2015, Disp: , Rfl:  .  aspirin 81 MG chewable tablet, Chew 81 mg by mouth daily. , Disp: , Rfl:  .  bumetanide (BUMEX) 1 MG tablet, Take 2 mg by mouth daily as needed (diuretic).  , Disp: , Rfl:  .  CALCIUM PO, Take 1,500 mg by mouth daily., Disp: , Rfl:  .  Cholecalciferol 2000 UNITS TABS, Take 2,000 Units by mouth daily. Reported on 02/03/2016, Disp: , Rfl:  .  clonazePAM (KLONOPIN) 1 MG tablet, Take 1 mg by mouth as needed for anxiety., Disp: , Rfl:  .  colchicine 0.6 MG tablet, Take 1 mg by mouth as needed (gout flare up). Reported on 09/22/2015, Disp: , Rfl: 3 .  Docusate Sodium (COLACE PO), Take 100 mg by mouth 2 (two) times daily. , Disp: , Rfl:  .  Elastic Bandages & Supports (RELIEF KNEE) MISC, Use as directed, Disp: , Rfl:  .  hydrocortisone 2.5 % cream, APPLY TO AFFECTED AREA TWICE A DAY, Disp: , Rfl: 12 .  lamoTRIgine (LAMICTAL) 200 MG tablet, Take 200 mg by mouth 2 (two) times daily. Reported on 09/22/2015, Disp: , Rfl:  .  lisinopril (PRINIVIL,ZESTRIL) 20 MG tablet, Take 20 mg by mouth daily., Disp: , Rfl: 3 .  metoprolol succinate (TOPROL-XL) 100 MG 24 hr tablet, Take 100 mg by mouth at bedtime. , Disp: , Rfl:  .  modafinil (PROVIGIL) 200 MG tablet, Take  200 mg by mouth 2 (two) times daily. , Disp: , Rfl:  .  Multiple Vitamin (MULTIVITAMIN) tablet, Take 1 tablet by mouth daily., Disp: , Rfl:  .  oxyCODONE HCl 15 MG TABA, Take 15 mg by mouth., Disp: , Rfl:  .  pantoprazole (PROTONIX) 40 MG tablet, Take 40 mg by mouth daily., Disp: , Rfl:  .  PREVIDENT 5000 DRY MOUTH 1.1 % GEL dental gel, See admin instructions., Disp: , Rfl: 5 .  sildenafil (VIAGRA) 100 MG tablet, Take 1 tablet (100 mg total) by mouth as needed for erectile dysfunction., Disp: 88 tablet, Rfl: 3 .  testosterone cypionate (DEPOTESTOSTERONE CYPIONATE) 200 MG/ML injection, Inject 1 mL (200 mg total) into the muscle once a week., Disp: 10 mL, Rfl: 0 .  tranylcypromine (PARNATE) 10 MG tablet, Take 30 mg by mouth 3 (three) times daily. Reported on 02/03/2016, Disp: , Rfl:   Allergies  Allergen Reactions  . Atorvastatin Other (See Comments)    Leg pain and severe headaches  . Demerol [Meperidine]      Interacts with other medication, causes blood pressure to raise  . Statins Other (See Comments)    Headaches/migraines  . Other Swelling and Rash    dermabond     Varicose veins of leg with pain, right     PLAN: The patient's right lower extremity was sterilely prepped and draped. The ultrasound machine was used to visualize the saphenous vein throughout its course. A segment just below the knee was selected for access. The saphenous vein was accessed without difficulty using ultrasound guidance with a micropuncture needle. A 0.018 wire was then placed beyond the saphenofemoral junction and the needle was removed. The 65 cm sheath was then placed over the wire and the wire and dilator were removed. The laser fiber was then placed through the sheath and its tip was placed approximately 4-5 centimeters below the saphenofemoral junction. Tumescent anesthesia was then created with a dilute lidocaine solution. Laser energy was then delivered with constant withdrawal of the sheath and laser fiber. Approximately 1439 joules of energy were delivered over a length of 39 centimeters using a 1470 Hz VenaCure machine at 7 W. Sterile dressings were placed. The patient tolerated the procedure well without obvious complications.   Follow-up in 1 week with post-laser duplex.

## 2018-12-26 ENCOUNTER — Other Ambulatory Visit: Payer: Self-pay

## 2018-12-26 ENCOUNTER — Ambulatory Visit (INDEPENDENT_AMBULATORY_CARE_PROVIDER_SITE_OTHER): Payer: BC Managed Care – PPO

## 2018-12-26 DIAGNOSIS — I83811 Varicose veins of right lower extremities with pain: Secondary | ICD-10-CM

## 2019-01-02 ENCOUNTER — Telehealth (INDEPENDENT_AMBULATORY_CARE_PROVIDER_SITE_OTHER): Payer: Self-pay | Admitting: Nurse Practitioner

## 2019-01-02 NOTE — Telephone Encounter (Signed)
Patient called stating that he has pain in Right leg where laser was done on 5/12. Patient states that his right calf is about 20% more swollen that the left. States that it hurts to touch and that the leg is warm to the touch. Would like to know if needs to be seen. Please advise. AS,CMA

## 2019-01-02 NOTE — Telephone Encounter (Signed)
Called patient, left message for him to return my call. AS, CMA

## 2019-01-02 NOTE — Telephone Encounter (Signed)
The ultrasound didn't show any evidence of a DVT, which would be the most worrying thing.  As long as the pain and swelling didn't just suddenly appear over the last few days, it is likely the GSV is still healing from the procedure.  If he can tolerate compression socks he should utilize them as well as elevating his lower extremity as much as possible.  If he can tolerate ibuprofen, 800mg  every six hours for a few days should help greatly.

## 2019-01-02 NOTE — Telephone Encounter (Signed)
Patient is aware of the below and verbalized understanding. AS, CMA 

## 2019-01-21 ENCOUNTER — Ambulatory Visit (INDEPENDENT_AMBULATORY_CARE_PROVIDER_SITE_OTHER): Payer: BC Managed Care – PPO | Admitting: Nurse Practitioner

## 2019-01-21 ENCOUNTER — Encounter (INDEPENDENT_AMBULATORY_CARE_PROVIDER_SITE_OTHER): Payer: Self-pay | Admitting: Nurse Practitioner

## 2019-01-21 ENCOUNTER — Other Ambulatory Visit: Payer: Self-pay

## 2019-01-21 VITALS — BP 133/81 | HR 88 | Resp 14 | Ht 75.0 in | Wt 340.0 lb

## 2019-01-21 DIAGNOSIS — I83811 Varicose veins of right lower extremities with pain: Secondary | ICD-10-CM | POA: Diagnosis not present

## 2019-01-21 DIAGNOSIS — I10 Essential (primary) hypertension: Secondary | ICD-10-CM | POA: Diagnosis not present

## 2019-01-21 DIAGNOSIS — Z9889 Other specified postprocedural states: Secondary | ICD-10-CM | POA: Diagnosis not present

## 2019-01-21 DIAGNOSIS — I890 Lymphedema, not elsewhere classified: Secondary | ICD-10-CM

## 2019-01-21 DIAGNOSIS — Z79899 Other long term (current) drug therapy: Secondary | ICD-10-CM

## 2019-01-21 NOTE — Progress Notes (Signed)
SUBJECTIVE:  Patient ID: Rodney Howard, male    DOB: 09/25/1965, 53 y.o.   MRN: [id number] Chief Complaint  Patient presents with  . Follow-up    HPI  Rodney Howard is a 53 y.o. male The patient returns to the office for followup status post laser ablation of the right great saphenous vein on 12/24/2018. The patient notes multiple residual varicosities bilaterally which continued to hurt with dependent positions and remained tender to palpation. The patient's swelling is unchanged from preoperative status. The patient continues to wear graduated compression stockings on a daily basis but these are not eliminating the pain and discomfort. The patient continues to use over-the-counter anti-inflammatory medications to treat the pain and related symptoms but this has not given the patient relief. The patient notes the pain in the lower extremities is causing problems with daily exercise, problems at work and even with household activities such as preparing meals and doing dishes.  The patient is otherwise done well and there have been no complications related to the laser procedure or interval changes in the patient's overall   Venous ultrasound post laser shows successful laser ablation of the right GSV, no DVT identified.  Past Medical History:  Diagnosis Date  . ADD (attention deficit disorder)   . Anxiety   . Bipolar 1 disorder (HCC)   . BPH (benign prostatic hyperplasia)   . DDD (degenerative disc disease), cervical   . Depression   . Erectile dysfunction   . GERD (gastroesophageal reflux disease)   . Gout   . HLD (hyperlipidemia)   . HTN (hypertension)   . Hypogonadism in male   . Lymphedema   . Nocturia   . Obesity   . Obesity   . Osteoarthritis   . Peripheral neuropathy    feet  . S/P insertion of spinal cord stimulator   . Sleep apnea    CPAP  . Tinea cruris     Past Surgical History:  Procedure Laterality Date  . BACK SURGERY    . CARDIAC CATHETERIZATION     . COLONOSCOPY WITH PROPOFOL N/A 06/25/2017   Procedure: COLONOSCOPY WITH PROPOFOL;  Surgeon: [name], [name], MD;  [facility];  Service: Endoscopy;  Laterality: N/A;  sleep apnea  . JOINT REPLACEMENT     x 3  . LUMBAR DISC SURGERY     x 3  . POLYPECTOMY N/A 06/25/2017   Procedure: POLYPECTOMY;  Surgeon: [name], [name], MD;  [facility];  Service: Endoscopy;  Laterality: N/A;  . STOMACH SURGERY     gastric sleeve  . TOE SURGERY    . TOTAL KNEE ARTHROPLASTY Left 2014  . UMBILICAL HERNIA REPAIR N/A 02/17/2016   Procedure: HERNIA REPAIR UMBILICAL ADULT;  Surgeon: [name], MD;  Location: [facility];  Service: General;  Laterality: N/A;  . WRIST SURGERY      Social History   Socioeconomic History  . Marital status: Married    Spouse name: Not on file  . Number of children: Not on file  . Years of education: Not on file  . Highest education level: Not on file  Occupational History  . Not on file  Social Needs  . Financial resource strain: Not on file  . Food insecurity:    Worry: Not on file    Inability: Not on file  . Transportation needs:    Medical: Not on file    Non-medical: Not on file  Tobacco Use  . Smoking status: Never  Smoker  . Smokeless tobacco: Never Used  Substance and Sexual Activity  . Alcohol use: Yes    Alcohol/week: 2.0 standard drinks    Types: 2 Cans of beer per week    Comment: occasional  . Drug use: No  . Sexual activity: Yes  Lifestyle  . Physical activity:    Days per week: Not on file    Minutes per session: Not on file  . Stress: Not on file  Relationships  . Social connections:    Talks on phone: Not on file    Gets together: Not on file    Attends religious service: Not on file    Active member of club or organization: Not on file    Attends meetings of clubs or organizations: Not on file    Relationship status: Not on file  . Intimate partner violence:    Fear of current or ex partner: Not on  file    Emotionally abused: Not on file    Physically abused: Not on file    Forced sexual activity: Not on file  Other Topics Concern  . Not on file  Social History Narrative  . Not on file    Family History  Problem Relation Age of Onset  . Arthritis Mother   . Deep vein thrombosis Mother   . Uterine cancer Mother   . Heart disease Mother   . Cirrhosis Sister   . Heart disease Father   . Diabetes Mellitus II Father        Mother, Sister  . Parkinson's disease Father   . Stroke Father   . Kidney disease Neg Hx   . Prostate cancer Neg Hx     Allergies  Allergen Reactions  . Atorvastatin Other (See Comments)    Leg pain and severe headaches  . Demerol [Meperidine]     Interacts with other medication, causes blood pressure to raise  . Statins Other (See Comments)    Headaches/migraines  . Other Swelling and Rash    dermabond     Review of Systems   Review of Systems: Negative Unless Checked Constitutional: [] Weight loss  [] Fever  [] Chills Cardiac: [] Chest pain   []  Atrial Fibrillation  [] Palpitations   [] Shortness of breath when laying flat   [] Shortness of breath with exertion. [] Shortness of breath at rest Vascular:  [] Pain in legs with walking   [] Pain in legs with standing [] Pain in legs when laying flat   [] Claudication    [] Pain in feet when laying flat    [] History of DVT   [] Phlebitis   [x] Swelling in legs   [x] Varicose veins   [] Non-healing ulcers Pulmonary:   [] Uses home oxygen   [] Productive cough   [] Hemoptysis   [] Wheeze  [] COPD   [] Asthma Neurologic:  [] Dizziness   [] Seizures  [] Blackouts [] History of stroke   [] History of TIA  [] Aphasia   [] Temporary Blindness   [] Weakness or numbness in arm   [] Weakness or numbness in leg Musculoskeletal:   [] Joint swelling   [] Joint pain   [] Low back pain  []  History of Knee Replacement [x] Arthritis [] back Surgeries  []  Spinal Stenosis    Hematologic:  [] Easy bruising  [] Easy bleeding   [] Hypercoagulable state   [] Anemic  Gastrointestinal:  [] Diarrhea   [] Vomiting  [] Gastroesophageal reflux/heartburn   [] Difficulty swallowing. [] Abdominal pain Genitourinary:  [] Chronic kidney disease   [] Difficult urination  [] Anuric   [] Blood in urine [] Frequent urination  [] Burning with urination   [] Hematuria Skin:  [] Rashes   []   Ulcers [] Wounds Psychological:  [x] History of anxiety   [x]  History of major depression  []  Memory Difficulties      OBJECTIVE:   Physical Exam  BP 133/81 (BP Location: Left Arm, Patient Position: Sitting, Cuff Size: Large)   Pulse 88   Resp 14   Ht 6\' 3"  (1.905 m)   Wt (!) 340 lb (154.2 kg)   BMI 42.50 kg/m   Gen: WD/WN, NAD Head: [place]/AT, No temporalis wasting.  Ear/Nose/Throat: Hearing grossly intact, nares w/o erythema or drainage Eyes: PER, EOMI, sclera nonicteric.  Neck: Supple, no masses.  No JVD.  Pulmonary:  Good air movement, no use of accessory muscles.  Cardiac: RRR Vascular: 2+ edema at ankle  Vessel Right Left  Radial Palpable Palpable  Dorsalis Pedis Palpable Palpable  Posterior Tibial Palpable Palpable   Gastrointestinal: soft, non-distended. No guarding/no peritoneal signs.  Musculoskeletal: M/S 5/5 throughout.  No deformity or atrophy.  Neurologic: Pain and light touch intact in extremities.  Symmetrical.  Speech is fluent. Motor exam as listed above. Psychiatric: Judgment intact, Mood & affect appropriate for pt's clinical situation. Dermatologic: No Venous rashes. No Ulcers Noted.  No changes consistent with cellulitis. Lymph : No Cervical lymphadenopathy, no lichenification or skin changes of chronic lymphedema.       ASSESSMENT AND PLAN:  1. Varicose veins of leg with pain, right Overall, his leg is feeling much better but the swelling still persists somewhat.  It is continuing to improve from the initial procedure.  At this time the patient wants to give his legs some time to heal before considering sclerotherapy.   2. Essential (primary) hypertension  Continue antihypertensive medications as already ordered, these medications have been reviewed and there are no changes at this time.   3. Lymphedema No surgery or intervention at this point in time.  I have reviewed my discussion with the patient regarding venous insufficiency and why it causes symptoms. I have discussed with the patient the chronic skin changes that accompany venous insufficiency and the long term sequela such as ulceration. Patient will contnue wearing graduated compression stockings on a daily basis, as this has provided excellent control of his edema. The patient will put the stockings on first thing in the morning and removing them in the evening. The patient is reminded not to sleep in the stockings.  In addition, behavioral modification including elevation during the day will be initiated. Exercise is strongly encouraged.    Current Outpatient Medications on File Prior to Visit  Medication Sig Dispense Refill  . anastrozole (ARIMIDEX) 1 MG tablet Take 1 tablet (1 mg total) by mouth daily. 90 tablet 3  . ARIPiprazole (ABILIFY) 15 MG tablet Take 15 mg by mouth at bedtime. Reported on 09/22/2015    . aspirin 81 MG chewable tablet Chew 81 mg by mouth daily.     . bumetanide (BUMEX) 1 MG tablet Take 2 mg by mouth daily as needed (diuretic).     . CALCIUM PO Take 1,500 mg by mouth daily.    . clonazePAM (KLONOPIN) 1 MG tablet Take 1 mg by mouth as needed for anxiety.    . colchicine 0.6 MG tablet Take 1 mg by mouth as needed (gout flare up). Reported on 09/22/2015  3  . Docusate Sodium (COLACE PO) Take 100 mg by mouth 2 (two) times daily.     . hydrocortisone 2.5 % cream APPLY TO AFFECTED AREA TWICE A DAY  12  . lamoTRIgine (LAMICTAL) 200 MG tablet Take 200 mg  by mouth 2 (two) times daily. Reported on 09/22/2015    . lisinopril (PRINIVIL,ZESTRIL) 20 MG tablet Take 20 mg by mouth daily.  3  . metoprolol succinate (TOPROL-XL) 100 MG 24 hr tablet Take 100 mg by mouth at bedtime.      . modafinil (PROVIGIL) 200 MG tablet Take 200 mg by mouth 2 (two) times daily.     . Multiple Vitamin (MULTIVITAMIN) tablet Take 1 tablet by mouth daily.    Marland Kitchen oxyCODONE HCl 15 MG TABA Take 15 mg by mouth.    . pantoprazole (PROTONIX) 40 MG tablet Take 40 mg by mouth daily.    Marland Kitchen PREVIDENT 5000 DRY MOUTH 1.1 % GEL dental gel See admin instructions.  5  . sildenafil (VIAGRA) 100 MG tablet Take 1 tablet (100 mg total) by mouth as needed for erectile dysfunction. 88 tablet 3  . testosterone cypionate (DEPOTESTOSTERONE CYPIONATE) 200 MG/ML injection Inject 1 mL (200 mg total) into the muscle once a week. 10 mL 0  . tranylcypromine (PARNATE) 10 MG tablet Take 30 mg by mouth 3 (three) times daily. Reported on 02/03/2016    . ALPRAZolam (XANAX) 0.5 MG tablet     . Cholecalciferol 2000 UNITS TABS Take 2,000 Units by mouth daily. Reported on 02/03/2016    . Elastic Bandages & Supports (RELIEF KNEE) MISC Use as directed     No current facility-administered medications on file prior to visit.     There are no Patient Instructions on file for this visit. Return in about 6 months (around 07/23/2019) for Lymphedema.   Kris Hartmann, NP  This note was completed with Sales executive.  Any errors are purely unintentional.

## 2019-02-07 ENCOUNTER — Ambulatory Visit: Payer: BC Managed Care – PPO | Admitting: Podiatry

## 2019-02-11 ENCOUNTER — Encounter: Payer: Self-pay | Admitting: Podiatry

## 2019-02-11 ENCOUNTER — Other Ambulatory Visit: Payer: Self-pay

## 2019-02-11 ENCOUNTER — Ambulatory Visit: Payer: BC Managed Care – PPO | Admitting: Podiatry

## 2019-02-11 VITALS — Temp 97.9°F

## 2019-02-11 DIAGNOSIS — B070 Plantar wart: Secondary | ICD-10-CM

## 2019-02-12 NOTE — Progress Notes (Signed)
   Subjective: 53 year old male presenting today with a chief complaint of a painful lesion noted to the plantar aspect of the left forefoot that appeared about 6 months ago. He states the area is becoming increasingly more painful. He has not had any treatment for the symptoms. Walking and bearing weight increases the pain. Patient is here for further evaluation and treatment.   Past Medical History:  Diagnosis Date  . ADD (attention deficit disorder)   . Anxiety   . Bipolar 1 disorder ([place])   . BPH (benign prostatic hyperplasia)   . DDD (degenerative disc disease), cervical   . Depression   . Erectile dysfunction   . GERD (gastroesophageal reflux disease)   . Gout   . HLD (hyperlipidemia)   . HTN (hypertension)   . Hypogonadism in male   . Lymphedema   . Nocturia   . Obesity   . Obesity   . Osteoarthritis   . Peripheral neuropathy    feet  . S/P insertion of spinal cord stimulator   . Sleep apnea    CPAP  . Tinea cruris     Objective: Physical Exam General: The patient is alert and oriented x3 in no acute distress.   Dermatology: Hyperkeratotic skin lesion noted to the plantar aspect of the left foot approximately 1 cm in diameter. Pinpoint bleeding noted upon debridement. Skin is warm, dry and supple bilateral lower extremities. Negative for open lesions or macerations.   Vascular: Palpable pedal pulses bilaterally. No edema or erythema noted. Capillary refill within normal limits.   Neurological: Epicritic and protective threshold grossly intact bilaterally.    Musculoskeletal Exam: Pain on palpation to the noted skin lesion.  Range of motion within normal limits to all pedal and ankle joints bilateral. Muscle strength 5/5 in all groups bilateral.    Assessment: #1 plantar wart left forefoot #2 pain in left foot     Plan of Care:  #1 Patient was evaluated. #2 Excisional debridement of the plantar wart lesion was performed using a chisel blade. Salinocaine was  applied and the lesion was dressed with a dry sterile dressing. #3 Recommended OTC corn and callus remover for the next 3-4 weeks.  #4 patient is to return to clinic as needed.   Edrick Kins, DPM [facility]  Dr. Edrick Kins, [place]                                        [place], [place] [address]                Office [phone]  Fax [phone]

## 2019-02-23 ENCOUNTER — Other Ambulatory Visit: Payer: Self-pay

## 2019-02-23 ENCOUNTER — Ambulatory Visit
Admission: EM | Admit: 2019-02-23 | Discharge: 2019-02-23 | Disposition: A | Discharge status: Nursing Home (Includes ICF) | Payer: BC Managed Care – PPO

## 2019-02-23 ENCOUNTER — Encounter: Payer: Self-pay | Admitting: Emergency Medicine

## 2019-02-23 DIAGNOSIS — L03116 Cellulitis of left lower limb: Secondary | ICD-10-CM | POA: Diagnosis not present

## 2019-02-23 NOTE — Discharge Instructions (Addendum)
You have been on 2 antibiotics and not improving. You need further work up.The differential is wide (skin infection, joint infection, blood clot, osteomyelitis, etc).  I recommend you go into the emergency department for labs and possible imaging today.   Honor Loh, MSN, APRN, FNP-C, CEN Advanced Practice Provider [facility] 02/23/2019 12:42 PM

## 2019-02-23 NOTE — ED Provider Notes (Signed)
[place], [place]   Name: Rodney Howard DOB: [birth year] MRN: [id number] CSN: [id number] PCP: Rolm GalaGrandis, [name], MD  Arrival date and time:  02/23/19 1123  Chief Complaint:  Cellulitis   NOTE: Prior to seeing the patient today, I have reviewed the triage nursing documentation and vital signs. Clinical staff has updated patient's PMH/PSHx, current medication list, and drug allergies/intolerances to ensure comprehensive history available to assist in medical decision making.   History:   HPI: Rodney Howard is a 53 y.o. male who presents today with complaints of cellulitis in his LEFT lower extremity/ankle/foot x 10 days. Patient has been seen by his PCP for the same. He denies associated fevers. He has taken a course of cephalexin, however notes that the treatment was ineffective. Patient returned called to PCP office and his antimicrobial coverage was subsequently switched to doxycycline. Patient presents today advising that symptoms still are not improving. He complains of erythema, edema, and significant swelling. He notes that the areas have been persistently warm. He has not appreciated any drainage. Patient continues to deny any fevers.   Past Medical History:  Diagnosis Date  . ADD (attention deficit disorder)   . Anxiety   . Bipolar 1 disorder (HCC)   . BPH (benign prostatic hyperplasia)   . DDD (degenerative disc disease), cervical   . Depression   . Erectile dysfunction   . GERD (gastroesophageal reflux disease)   . Gout   . HLD (hyperlipidemia)   . HTN (hypertension)   . Hypogonadism in male   . Lymphedema   . Nocturia   . Obesity   . Obesity   . Osteoarthritis   . Peripheral neuropathy    feet  . S/P insertion of spinal cord stimulator   . Sleep apnea    CPAP  . Tinea cruris     Past Surgical History:  Procedure Laterality Date  . BACK SURGERY    . CARDIAC CATHETERIZATION    . COLONOSCOPY WITH PROPOFOL N/A 06/25/2017   Procedure: COLONOSCOPY WITH PROPOFOL;   Surgeon: [name], [name], MD;  [facility];  Service: Endoscopy;  Laterality: N/A;  sleep apnea  . JOINT REPLACEMENT     x 3  . LUMBAR DISC SURGERY     x 3  . POLYPECTOMY N/A 06/25/2017   Procedure: POLYPECTOMY;  Surgeon: [name], [name], MD;  [facility];  Service: Endoscopy;  Laterality: N/A;  . STOMACH SURGERY     gastric sleeve  . TOE SURGERY    . TOTAL KNEE ARTHROPLASTY Left 2014  . UMBILICAL HERNIA REPAIR N/A 02/17/2016   Procedure: HERNIA REPAIR UMBILICAL ADULT;  Surgeon: [name], MD;  Location: [facility];  Service: General;  Laterality: N/A;  . WRIST SURGERY      Family History  Problem Relation Age of Onset  . Arthritis Mother   . Deep vein thrombosis Mother   . Uterine cancer Mother   . Heart disease Mother   . Cirrhosis Sister   . Heart disease Father   . Diabetes Mellitus II Father        Mother, Sister  . Parkinson's disease Father   . Stroke Father   . Kidney disease Neg Hx   . Prostate cancer Neg Hx     Social History   Tobacco Use  . Smoking status: Never Smoker  . Smokeless tobacco: Never Used  Substance Use Topics  . Alcohol use: Yes    Alcohol/week: 2.0 standard drinks    Types: 2  Cans of beer per week    Comment: occasional  . Drug use: No    Patient Active Problem List   Diagnosis Date Noted  . Varicose veins of leg with pain, right 12/24/2018  . Superficial thrombophlebitis 10/16/2018  . Chronic venous insufficiency 02/26/2018  . ADD (attention deficit disorder) 01/15/2018  . Seborrheic dermatitis 12/05/2017  . Obesity 10/26/2017  . Lymphedema 10/26/2017  . Personal history of colonic polyps   . Rectal polyp   . Postlaminectomy syndrome, lumbar 07/04/2016  . Spondylolisthesis of lumbar region 07/04/2016  . Umbilical hernia without obstruction and without gangrene   . Pure hypercholesterolemia 02/01/2016  . Arthritis, degenerative 02/01/2016  . Adiposity 02/01/2016  . Gout 02/01/2016  . Acid reflux  02/01/2016  . Essential (primary) hypertension 02/01/2016  . Narrowing of intervertebral disc space 02/01/2016  . Bipolar I disorder, most recent episode depressed ([place]) 02/01/2016  . Other specified behavioral and emotional disorders with onset usually occurring in childhood and adolescence 02/01/2016  . Encounter for preprocedural cardiovascular examination 11/09/2015  . H/O total knee replacement 10/25/2015  . Morbid obesity ([place]) 10/25/2015  . Loosening of knee joint prosthesis ([place]) 10/25/2015  . Hypogonadism in male 09/22/2015  . Erectile dysfunction of organic origin 09/22/2015  . BPH with obstruction/lower urinary tract symptoms 09/22/2015  . History of surgical procedure 04/22/2015  . Acquired lymphedema 02/23/2014  . Obstructive apnea 10/13/2013  . Fatty infiltration of liver 10/07/2012  . Arteriosclerosis of coronary artery 10/07/2012    Home Medications:    Current Meds  Medication Sig  . anastrozole (ARIMIDEX) 1 MG tablet Take 1 tablet (1 mg total) by mouth daily.  . ARIPiprazole (ABILIFY) 15 MG tablet Take 15 mg by mouth at bedtime. Reported on 09/22/2015  . aspirin 81 MG chewable tablet Chew 81 mg by mouth daily.   Marland Kitchen CALCIUM PO Take 1,500 mg by mouth daily.  . Cholecalciferol 2000 UNITS TABS Take 2,000 Units by mouth daily. Reported on 02/03/2016  . colchicine 0.6 MG tablet Take 1 mg by mouth as needed (gout flare up). Reported on 09/22/2015  . doxycycline (MONODOX) 100 MG capsule Take by mouth.  . lamoTRIgine (LAMICTAL) 200 MG tablet Take 200 mg by mouth 2 (two) times daily. Reported on 09/22/2015  . lisinopril (PRINIVIL,ZESTRIL) 20 MG tablet Take 20 mg by mouth daily.  . metoprolol succinate (TOPROL-XL) 100 MG 24 hr tablet Take 100 mg by mouth at bedtime.   . Multiple Vitamin (MULTIVITAMIN) tablet Take 1 tablet by mouth daily.  Marland Kitchen oxyCODONE HCl 15 MG TABA Take 15 mg by mouth.  . pantoprazole (PROTONIX) 40 MG tablet Take 40 mg by mouth daily.  Marland Kitchen PREVIDENT 5000 DRY MOUTH  1.1 % GEL dental gel See admin instructions.  Marland Kitchen testosterone cypionate (DEPOTESTOSTERONE CYPIONATE) 200 MG/ML injection Inject 1 mL (200 mg total) into the muscle once a week.  . tranylcypromine (PARNATE) 10 MG tablet Take 30 mg by mouth 3 (three) times daily. Reported on 02/03/2016    Allergies:   Atorvastatin, Demerol [meperidine], Statins, and Other  Review of Systems (ROS): Review of Systems  Constitutional: Negative for chills and fever.  Respiratory: Negative for cough and shortness of breath.   Cardiovascular: Negative for chest pain and palpitations.  Gastrointestinal: Negative for diarrhea, nausea and vomiting.  Musculoskeletal: Positive for gait problem (2/2 pain) and joint swelling.  Skin: Positive for color change.  Neurological: Negative for dizziness, syncope, weakness and headaches.  Hematological: Negative for adenopathy.     Vital Signs:  Today's Vitals   02/23/19 1208 02/23/19 1213  BP:  116/74  Pulse:  84  Resp:  16  Temp:  98.5 F (36.9 C)  TempSrc:  Oral  SpO2:  100%  Weight: (!) 335 lb (152 kg)   Height: 6\' 3"  (1.905 m)   PainSc: 3      Physical Exam: Physical Exam  Constitutional: He is oriented to person, place, and time and well-developed, well-nourished, and in no distress.  HENT:  Head: Normocephalic and atraumatic.  Mouth/Throat: Mucous membranes are normal.  Eyes: Pupils are equal, round, and reactive to light. EOM are normal.  Cardiovascular: Normal rate and intact distal pulses.  Pulmonary/Chest: No accessory muscle usage. No respiratory distress.  Musculoskeletal:     Left lower leg: He exhibits tenderness and swelling.       Legs:     Comments: LEFT lower leg markedly swollen as compared contralaterally; swelling extends from distal tib/fib to ankle and toes. Capillary refill WNL. Skin erythematous and warmth to touch. No drainage. TTP overall; does not extend into calf area; no claudication pain.  Neurological: He is alert and oriented  to person, place, and time. Gait normal. GCS score is 15.  Skin: Skin is warm and dry. There is erythema (see LLE documentation under MSK section).  Psychiatric: Mood, memory, affect and judgment normal.  Nursing note and vitals reviewed.   Urgent Care Treatments / Results:   LABS: PLEASE NOTE: all labs that were ordered this encounter are listed, however only abnormal results are displayed. Labs Reviewed - No data to display  EKG: -None  RADIOLOGY: No results found.  PROCEDURES: Procedures  MEDICATIONS RECEIVED THIS VISIT: Medications - No data to display  PERTINENT CLINICAL COURSE NOTES/UPDATES:   Initial Impression / Assessment and Plan / Urgent Care Course:  Pertinent labs & imaging results that were available during my care of the patient were personally reviewed by me and considered in my medical decision making (see lab/imaging section of note for values and interpretations).  Rodney Howard is a 53 y.o. male who [facility] with complaints of Cellulitis  Patient overall well appearing and in no acute distress today in clinic. Exam reveals cellulitic changes to LEFT lower extremity/ankle/foot. Patient has been on both cephalexin and doxycycline, with no perceived improvement. He denies fevers. Discussed need for further workup, which would include labs and imaging. Discuss concerning differentials: skin infection, septic joint, osteomyelitis, DVT, and sepsis. Patient advised that treatment at this point is likely going to require IV antimicrobial coverage given the persistence of his symptoms on oral treatment as an outpatient. Patient advised that unfortunately we do not have IV antibiotics in the urgent care setting, which ultimately means that he will need to pursue care in the emergency department. He verbalized understanding and agreement. Offered to start workup here in urgent care by getting labs and plain films, however patient declined and  advised that he would prefer to have everything done in one location. D[facility]ed available care facilities: [facility], [facility], [facility], [facility]. Patient states, "I will decide, but I cannot go until tomorrow". Encouraged patient to reconsider decision and proceed to definitive care today, however he refused. Patient to continue doxycycline until seen. He has pain medications to use at home already.   I have reviewed the follow up and strict return precautions for any new or worsening symptoms. Patient is aware of symptoms that would be deemed urgent/emergent, and would thus require further evaluation either here or  in the emergency department. At the time of discharge, he verbalized understanding and consent with the discharge plan as it was reviewed with him. All questions were fielded by provider and/or clinic staff prior to patient discharge.    Final Clinical Impressions / Urgent Care Diagnoses:   Final diagnoses:  Cellulitis of left lower extremity    New Prescriptions:  [place] Controlled Substance Registry consulted? Not Applicable  No orders of the defined types were placed in this encounter.   Recommended Follow up Care:  Patient encouraged to follow up with the following provider within the specified time frame, or sooner as dictated by the severity of his symptoms. As always, he was instructed that for any urgent/emergent care needs, he should seek care either here or in the emergency department for more immediate evaluation.  Follow-up Information    Call  Rolm GalaGrandis, [name], MD.   [facility] Contact information: [address] [place] [address] [place] [address] [phone]        Go to  EMERGENCY DEPARTMENT.   Why: Further evaluation and treatment of cellulitis        NOTE: This note was prepared using Dragon dictation software along with smaller Lobbyistphrase technology. Despite my best ability to proofread, there is the potential that transcriptional errors may still occur from this process,  and are completely unintentional.      Verlee MonteGray, Nyeem Stoke E, NP 02/23/19 2112

## 2019-02-23 NOTE — ED Triage Notes (Signed)
Patient c/o cellulitis in his left lower leg and foot 10 days ago.  Patient has been on Keflex.  Patient is currently on Doxycycline.  Patient states that his symptoms have not improved.  Patient denies recent fevers.

## 2019-02-24 ENCOUNTER — Emergency Department: Payer: BC Managed Care – PPO

## 2019-02-24 ENCOUNTER — Other Ambulatory Visit: Payer: Self-pay

## 2019-02-24 ENCOUNTER — Emergency Department
Admission: EM | Admit: 2019-02-24 | Discharge: 2019-02-24 | Disposition: A | Discharge status: Home / Self Care | Payer: BC Managed Care – PPO | Attending: Emergency Medicine | Admitting: Emergency Medicine

## 2019-02-24 ENCOUNTER — Encounter: Payer: Self-pay | Admitting: Emergency Medicine

## 2019-02-24 DIAGNOSIS — Z7982 Long term (current) use of aspirin: Secondary | ICD-10-CM | POA: Insufficient documentation

## 2019-02-24 DIAGNOSIS — Z79899 Other long term (current) drug therapy: Secondary | ICD-10-CM | POA: Insufficient documentation

## 2019-02-24 DIAGNOSIS — L03116 Cellulitis of left lower limb: Secondary | ICD-10-CM | POA: Diagnosis not present

## 2019-02-24 DIAGNOSIS — I10 Essential (primary) hypertension: Secondary | ICD-10-CM | POA: Insufficient documentation

## 2019-02-24 DIAGNOSIS — R2242 Localized swelling, mass and lump, left lower limb: Secondary | ICD-10-CM | POA: Diagnosis present

## 2019-02-24 LAB — BASIC METABOLIC PANEL
Anion gap: 8 (ref 5–15)
BUN: 17 mg/dL (ref 6–20)
CO2: 29 mmol/L (ref 22–32)
Calcium: 9.5 mg/dL (ref 8.9–10.3)
Chloride: 101 mmol/L (ref 98–111)
Creatinine, Ser: 1.4 mg/dL — ABNORMAL HIGH (ref 0.61–1.24)
GFR calc Af Amer: >60 mL/min (ref >60)
GFR calc non Af Amer: 57 mL/min — ABNORMAL LOW (ref >60)
Glucose, Bld: 75 mg/dL (ref 70–99)
Potassium: 4.3 mmol/L (ref 3.5–5.1)
Sodium: 138 mmol/L (ref 135–145)

## 2019-02-24 LAB — CBC WITH DIFFERENTIAL/PLATELET
Abs Immature Granulocytes: 0.09 10*3/uL — ABNORMAL HIGH (ref 0.00–0.07)
Basophils Absolute: 0.1 10*3/uL (ref 0.0–0.1)
Basophils Relative: 1 %
Eosinophils Absolute: 0.4 10*3/uL (ref 0.0–0.5)
Eosinophils Relative: 5 %
HCT: 45.2 % (ref 39.0–52.0)
Hemoglobin: 14.7 g/dL (ref 13.0–17.0)
Immature Granulocytes: 1 %
Lymphocytes Relative: 32 %
Lymphs Abs: 2.2 10*3/uL (ref 0.7–4.0)
MCH: 29.6 pg (ref 26.0–34.0)
MCHC: 32.5 g/dL (ref 30.0–36.0)
MCV: 90.9 fL (ref 80.0–100.0)
Monocytes Absolute: 0.5 10*3/uL (ref 0.1–1.0)
Monocytes Relative: 8 %
Neutro Abs: 3.6 10*3/uL (ref 1.7–7.7)
Neutrophils Relative %: 53 %
Platelets: 340 10*3/uL (ref 150–400)
RBC: 4.97 MIL/uL (ref 4.22–5.81)
RDW: 13.8 % (ref 11.5–15.5)
WBC: 6.8 10*3/uL (ref 4.0–10.5)
nRBC: 0 % (ref 0.0–0.2)

## 2019-02-24 MED ORDER — CEPHALEXIN 500 MG PO CAPS: 500.0000 mg | ORAL_CAPSULE | Freq: Three times a day (TID) | ORAL | 0 refills | Status: AC

## 2019-02-24 MED ORDER — DOXYCYCLINE HYCLATE 100 MG PO CAPS: 100.0000 mg | ORAL_CAPSULE | Freq: Two times a day (BID) | ORAL | 0 refills | Status: AC

## 2019-02-24 NOTE — Discharge Instructions (Signed)
Results for orders placed or performed during the hospital encounter of 16/10/96  Basic metabolic panel  Result Value Ref Range   Sodium 138 135 - 145 mmol/L   Potassium 4.3 3.5 - 5.1 mmol/L   Chloride 101 98 - 111 mmol/L   CO2 29 22 - 32 mmol/L   Glucose, Bld 75 70 - 99 mg/dL   BUN 17 6 - 20 mg/dL   Creatinine, Ser 1.40 (H) 0.61 - 1.24 mg/dL   Calcium 9.5 8.9 - 10.3 mg/dL   GFR calc non Af Amer 57 (L) >60 mL/min   GFR calc Af Amer >60 >60 mL/min   Anion gap 8 5 - 15  CBC with Differential  Result Value Ref Range   WBC 6.8 4.0 - 10.5 K/uL   RBC 4.97 4.22 - 5.81 MIL/uL   Hemoglobin 14.7 13.0 - 17.0 g/dL   HCT 45.2 39.0 - 52.0 %   MCV 90.9 80.0 - 100.0 fL   MCH 29.6 26.0 - 34.0 pg   MCHC 32.5 30.0 - 36.0 g/dL   RDW 13.8 11.5 - 15.5 %   Platelets 340 150 - 400 K/uL   nRBC 0.0 0.0 - 0.2 %   Neutrophils Relative % 53 %   Neutro Abs 3.6 1.7 - 7.7 K/uL   Lymphocytes Relative 32 %   Lymphs Abs 2.2 0.7 - 4.0 K/uL   Monocytes Relative 8 %   Monocytes Absolute 0.5 0.1 - 1.0 K/uL   Eosinophils Relative 5 %   Eosinophils Absolute 0.4 0.0 - 0.5 K/uL   Basophils Relative 1 %   Basophils Absolute 0.1 0.0 - 0.1 K/uL   Immature Granulocytes 1 %   Abs Immature Granulocytes 0.09 (H) 0.00 - 0.07 K/uL   US Venous Img Lower Unilateral Left  Result Date: 02/24/2019 CLINICAL DATA:  Left lower extremity pain and edema. Lower extremity erythema. Evaluate for DVT. EXAM: LEFT LOWER EXTREMITY VENOUS DOPPLER ULTRASOUND TECHNIQUE: Gray-scale sonography with graded compression, as well as color Doppler and duplex ultrasound were performed to evaluate the lower extremity deep venous systems from the level of the common femoral vein and including the common femoral, femoral, profunda femoral, popliteal and calf veins including the posterior tibial, peroneal and gastrocnemius veins when visible. The superficial great saphenous vein was also interrogated. Spectral Doppler was utilized to evaluate flow at rest  and with distal augmentation maneuvers in the common femoral, femoral and popliteal veins. COMPARISON:  Left lower extremity venous Doppler ultrasound-01/18/2016 FINDINGS: Contralateral Common Femoral Vein: Respiratory phasicity is normal and symmetric with the symptomatic side. No evidence of thrombus. Normal compressibility. Common Femoral Vein: No evidence of thrombus. Normal compressibility, respiratory phasicity and response to augmentation. Saphenofemoral Junction: No evidence of thrombus. Normal compressibility and flow on color Doppler imaging. Profunda Femoral Vein: No evidence of thrombus. Normal compressibility and flow on color Doppler imaging. Femoral Vein: No evidence of thrombus. Normal compressibility, respiratory phasicity and response to augmentation. Popliteal Vein: No evidence of thrombus. Normal compressibility, respiratory phasicity and response to augmentation. Calf Veins: No evidence of thrombus. Normal compressibility and flow on color Doppler imaging. Superficial Great Saphenous Vein: No evidence of thrombus. Normal compressibility. Venous Reflux:  None. Other Findings:  None. IMPRESSION: No evidence of DVT within the left lower extremity. Electronically Signed   By: Sandi Mariscal M.D.   On: 02/24/2019 08:37

## 2019-02-24 NOTE — ED Provider Notes (Signed)
[facility] Emergency Department Provider Note  ____________________________________________  Time seen: Approximately 10:34 AM  I have reviewed the triage vital signs and the nursing notes.   HISTORY  Chief Complaint Left leg swelling and redness   HPI Rodney Howard is a 53 y.o. male with a history of bipolar 1 disorder, GERD, hypertension, morbid obesity who comes to the ED complaining of leg pain redness and swelling for the past 8 to 10 days.  Gradual onset, constant.  Saw his doctor a week ago who started him on Keflex and then doxycycline.  He has taken both of these for the past week.  Denies fever chills sweats body aches.  No recent trauma.  No chest pain or shortness of breath.  Went to urgent care yesterday due to rash not significantly improving.  Was told that he would possibly need IV antibiotics and so patient comes to the ED today for assessment.  Pain is worse with walking, no alleviating factors, nonradiating, mild to moderate intensity.      Past Medical History:  Diagnosis Date  . ADD (attention deficit disorder)   . Anxiety   . Bipolar 1 disorder (HCC)   . BPH (benign prostatic hyperplasia)   . DDD (degenerative disc disease), cervical   . Depression   . Erectile dysfunction   . GERD (gastroesophageal reflux disease)   . Gout   . HLD (hyperlipidemia)   . HTN (hypertension)   . Hypogonadism in male   . Lymphedema   . Nocturia   . Obesity   . Obesity   . Osteoarthritis   . Peripheral neuropathy    feet  . S/P insertion of spinal cord stimulator   . Sleep apnea    CPAP  . Tinea cruris      Patient Active Problem List   Diagnosis Date Noted  . Varicose veins of leg with pain, right 12/24/2018  . Superficial thrombophlebitis 10/16/2018  . Chronic venous insufficiency 02/26/2018  . ADD (attention deficit disorder) 01/15/2018  . Seborrheic dermatitis 12/05/2017  . Obesity 10/26/2017  . Lymphedema 10/26/2017  . Personal  history of colonic polyps   . Rectal polyp   . Postlaminectomy syndrome, lumbar 07/04/2016  . Spondylolisthesis of lumbar region 07/04/2016  . Umbilical hernia without obstruction and without gangrene   . Pure hypercholesterolemia 02/01/2016  . Arthritis, degenerative 02/01/2016  . Adiposity 02/01/2016  . Gout 02/01/2016  . Acid reflux 02/01/2016  . Essential (primary) hypertension 02/01/2016  . Narrowing of intervertebral disc space 02/01/2016  . Bipolar I disorder, most recent episode depressed (HCC) 02/01/2016  . Other specified behavioral and emotional disorders with onset usually occurring in childhood and adolescence 02/01/2016  . Encounter for preprocedural cardiovascular examination 11/09/2015  . H/O total knee replacement 10/25/2015  . Morbid obesity (HCC) 10/25/2015  . Loosening of knee joint prosthesis (HCC) 10/25/2015  . Hypogonadism in male 09/22/2015  . Erectile dysfunction of organic origin 09/22/2015  . BPH with obstruction/lower urinary tract symptoms 09/22/2015  . History of surgical procedure 04/22/2015  . Acquired lymphedema 02/23/2014  . Obstructive apnea 10/13/2013  . Fatty infiltration of liver 10/07/2012  . Arteriosclerosis of coronary artery 10/07/2012     Past Surgical History:  Procedure Laterality Date  . BACK SURGERY    . CARDIAC CATHETERIZATION    . COLONOSCOPY WITH PROPOFOL N/A 06/25/2017   Procedure: COLONOSCOPY WITH PROPOFOL;  Surgeon: [name], [name], MD;  [facility];  Service: Endoscopy;  Laterality: N/A;  sleep apnea  .  JOINT REPLACEMENT     x 3  . LUMBAR DISC SURGERY     x 3  . POLYPECTOMY N/A 06/25/2017   Procedure: POLYPECTOMY;  Surgeon: Midge Minium, MD;  Location: [facility];  Service: Endoscopy;  Laterality: N/A;  . STOMACH SURGERY     gastric sleeve  . TOE SURGERY    . TOTAL KNEE ARTHROPLASTY Left 2014  . UMBILICAL HERNIA REPAIR N/A 02/17/2016   Procedure: HERNIA REPAIR UMBILICAL ADULT;  Surgeon: Lattie Haw, MD;  Location: [facility];  Service: General;  Laterality: N/A;  . WRIST SURGERY       Prior to Admission medications   Medication Sig Start Date End Date Taking? Authorizing Provider  ALPRAZolam [name]) 0.5 MG tablet  02/23/18   [provider]  anastrozole (ARIMIDEX) 1 MG tablet Take 1 tablet (1 mg total) by mouth daily. 07/22/18   Stoioff, Verna Czech, MD  ARIPiprazole (ABILIFY) 15 MG tablet Take 15 mg by mouth at bedtime. Reported on 09/22/2015 07/21/13   [provider]  aspirin 81 MG chewable tablet Chew 81 mg by mouth daily.     [provider]  bumetanide (BUMEX) 1 MG tablet Take 2 mg by mouth daily as needed (diuretic).  02/23/14 06/11/22  [provider]  CALCIUM PO Take 1,500 mg by mouth daily.    [provider]  cephALEXin (KEFLEX) 500 MG capsule Take 1 capsule (500 mg total) by mouth 3 (three) times daily for 10 days. 02/24/19 03/06/19  Sharman Cheek, MD  Cholecalciferol 2000 UNITS TABS Take 2,000 Units by mouth daily. Reported on 02/03/2016    [provider]  clonazePAM (KLONOPIN) 1 MG tablet Take 1 mg by mouth as needed for anxiety.    [provider]  colchicine 0.6 MG tablet Take 1 mg by mouth as needed (gout flare up). Reported on 09/22/2015 07/23/14   [provider]  Docusate Sodium (COLACE PO) Take 100 mg by mouth 2 (two) times daily.     [provider]  doxycycline (MONODOX) 100 MG capsule Take by mouth. 02/18/19 02/28/19  [provider]  doxycycline (VIBRAMYCIN) 100 MG capsule Take 1 capsule (100 mg total) by mouth 2 (two) times daily for 10 days. 02/24/19 03/06/19  Sharman Cheek, MD  Elastic Bandages & Supports (RELIEF KNEE) MISC Use as directed 09/13/17   [provider]  hydrocortisone 2.5 % cream APPLY TO AFFECTED AREA TWICE A DAY 01/28/18   [provider]  lamoTRIgine (LAMICTAL) 200 MG tablet Take 200 mg by mouth 2 (two) times daily. Reported on 09/22/2015     [provider]  lisinopril (PRINIVIL,ZESTRIL) 20 MG tablet Take 20 mg by mouth daily. 02/07/18   [provider]  metoprolol succinate (TOPROL-XL) 100 MG 24 hr tablet Take 100 mg by mouth at bedtime.  10/08/13 06/11/22  [provider]  modafinil (PROVIGIL) 200 MG tablet Take 200 mg by mouth 2 (two) times daily.     [provider]  Multiple Vitamin (MULTIVITAMIN) tablet Take 1 tablet by mouth daily.    [provider]  oxyCODONE HCl 15 MG TABA Take 15 mg by mouth.    [provider]  pantoprazole (PROTONIX) 40 MG tablet Take 40 mg by mouth daily.    [provider]  PREVIDENT 5000 DRY MOUTH 1.1 % GEL dental gel See admin instructions. 09/24/17   [provider]  sildenafil (VIAGRA) 100 MG tablet Take 1 tablet (100 mg total) by mouth as  needed for erectile dysfunction. 11/13/17   Stoioff, Verna CzechScott C, MD  testosterone cypionate (DEPOTESTOSTERONE CYPIONATE) 200 MG/ML injection Inject 1 mL (200 mg total) into the muscle once a week. 12/24/18   Stoioff, Verna CzechScott C, MD  tranylcypromine (PARNATE) 10 MG tablet Take 30 mg by mouth 3 (three) times daily. Reported on 02/03/2016    [provider]     Allergies Atorvastatin, Demerol [meperidine], Statins, and Other   Family History  Problem Relation Age of Onset  . Arthritis Mother   . Deep vein thrombosis Mother   . Uterine cancer Mother   . Heart disease Mother   . Cirrhosis Sister   . Heart disease Father   . Diabetes Mellitus II Father        Mother, Sister  . Parkinson's disease Father   . Stroke Father   . Kidney disease Neg Hx   . Prostate cancer Neg Hx     Social History Social History   Tobacco Use  . Smoking status: Never Smoker  . Smokeless tobacco: Never Used  Substance Use Topics  . Alcohol use: Yes    Alcohol/week: 2.0 standard drinks    Types: 2 Cans of beer per week    Comment: occasional  . Drug use: No    Review of Systems  Constitutional:    No fever or chills.  ENT:   No sore throat. No rhinorrhea. Cardiovascular:   No chest pain or syncope. Respiratory:   No dyspnea or cough. Gastrointestinal:   Negative for abdominal pain, vomiting and diarrhea.  Musculoskeletal:   Left leg pain and swelling as above All other systems reviewed and are negative except as documented above in ROS and HPI.  ____________________________________________   PHYSICAL EXAM:  VITAL SIGNS: ED Triage Vitals  Enc Vitals Group     BP 02/24/19 0731 128/76     Pulse Rate 02/24/19 0731 88     Resp 02/24/19 0731 16     Temp 02/24/19 0731 98.9 F (37.2 C)     Temp Source 02/24/19 0731 Oral     SpO2 02/24/19 0731 100 %     Weight 02/24/19 0732 (!) 335 lb 1.6 oz (152 kg)     Height 02/24/19 0732 6\' 3"  (1.905 m)     Head Circumference --      Peak Flow --      Pain Score 02/24/19 0732 4     Pain Loc --      Pain Edu? --      Excl. in GC? --     Vital signs reviewed, nursing assessments reviewed.   Constitutional:   Alert and oriented. Non-toxic appearance. Eyes:   Conjunctivae are normal. EOMI. PERRL. ENT      Head:   Normocephalic and atraumatic.      Nose:   No congestion/rhinnorhea.       Mouth/Throat:   MMM, no pharyngeal erythema. No peritonsillar mass.       Neck:   No meningismus. Full ROM. Hematological/Lymphatic/Immunilogical:   No cervical lymphadenopathy. Cardiovascular:   RRR. Symmetric bilateral radial and DP pulses.  No murmurs. Cap refill less than 2 seconds. Respiratory:   Normal respiratory effort without tachypnea/retractions. Breath sounds are clear and equal bilaterally. No wheezes/rales/rhonchi. Gastrointestinal:   Soft and nontender. Non distended. There is no CVA tenderness.  No rebound, rigidity, or guarding.  Musculoskeletal:   Normal range of motion in all extremities. No joint effusions.  No lower extremity tenderness.  2+ pitting edema bilateral  lower extremities, left greater than right.  There is hazy erythema of  the left lower leg, circumferentially at the lower shin spanning about 5 cm longitudinally  Neurologic:   Normal speech and language.  Motor grossly intact. No acute focal neurologic deficits are appreciated.  Skin:    Skin is warm, dry with erythema over the left lower leg as above.  No crepitus.  No fluctuance or drainage or wounds.  No petechia purpura or bullae ____________________________________________    LABS (pertinent positives/negatives) (all labs ordered are listed, but only abnormal results are displayed) Labs Reviewed  BASIC METABOLIC PANEL - Abnormal; Notable for the following components:      Result Value   Creatinine, Ser 1.40 (*)    GFR calc non Af Amer 57 (*)    All other components within normal limits  CBC WITH DIFFERENTIAL/PLATELET - Abnormal; Notable for the following components:   Abs Immature Granulocytes 0.09 (*)    All other components within normal limits   ____________________________________________   EKG    ____________________________________________    RADIOLOGY  US Venous Img Lower Unilateral Left  Result Date: 02/24/2019 CLINICAL DATA:  Left lower extremity pain and edema. Lower extremity erythema. Evaluate for DVT. EXAM: LEFT LOWER EXTREMITY VENOUS DOPPLER ULTRASOUND TECHNIQUE: Gray-scale sonography with graded compression, as well as color Doppler and duplex ultrasound were performed to evaluate the lower extremity deep venous systems from the level of the common femoral vein and including the common femoral, femoral, profunda femoral, popliteal and calf veins including the posterior tibial, peroneal and gastrocnemius veins when visible. The superficial great saphenous vein was also interrogated. Spectral Doppler was utilized to evaluate flow at rest and with distal augmentation maneuvers in the common femoral, femoral and popliteal veins. COMPARISON:  Left lower extremity venous Doppler ultrasound-01/18/2016 FINDINGS: Contralateral Common  Femoral Vein: Respiratory phasicity is normal and symmetric with the symptomatic side. No evidence of thrombus. Normal compressibility. Common Femoral Vein: No evidence of thrombus. Normal compressibility, respiratory phasicity and response to augmentation. Saphenofemoral Junction: No evidence of thrombus. Normal compressibility and flow on color Doppler imaging. Profunda Femoral Vein: No evidence of thrombus. Normal compressibility and flow on color Doppler imaging. Femoral Vein: No evidence of thrombus. Normal compressibility, respiratory phasicity and response to augmentation. Popliteal Vein: No evidence of thrombus. Normal compressibility, respiratory phasicity and response to augmentation. Calf Veins: No evidence of thrombus. Normal compressibility and flow on color Doppler imaging. Superficial Great Saphenous Vein: No evidence of thrombus. Normal compressibility. Venous Reflux:  None. Other Findings:  None. IMPRESSION: No evidence of DVT within the left lower extremity. Electronically Signed   By: Sandi Mariscal M.D.   On: 02/24/2019 08:37    ____________________________________________   PROCEDURES Procedures  ____________________________________________  DIFFERENTIAL DIAGNOSIS   DVT, cellulitis, chronic lymphedema.  Doubt abscess necrotizing fasciitis osteomyelitis septic arthritis.  No evidence of fracture.  CLINICAL IMPRESSION / ASSESSMENT AND PLAN / ED COURSE  Medications ordered in the ED: Medications - No data to display  Pertinent labs & imaging results that were available during my care of the patient were reviewed by me and considered in my medical decision making (see chart for details).  Rodney Howard was evaluated in Emergency Department on 02/24/2019 for the symptoms described in the history of present illness. He was evaluated in the context of the global COVID-19 pandemic, which necessitated consideration that the patient might be at risk for infection with the SARS-CoV-2  virus that causes COVID-19. Institutional protocols and algorithms that  pertain to the evaluation of patients at risk for COVID-19 are in a state of rapid change based on information released by regulatory bodies including the CDC and federal and state organizations. These policies and algorithms were followed during the patient's care in the ED.     Clinical Course as of Feb 24 1036  Mon Feb 24, 2019  [id number] Patient with a history of GERD hypertension comes the ED with 7 to 10 days of left leg redness swelling and pain.  Vital signs are normal, exam actually seems a little improved compared to description from his primary care provider a week ago.  No evidence of osteomyelitis necrotizing fasciitis or abscess.  Either a slowly improving cellulitis or DVT.  I will check labs and ultrasound.  Patient not diabetic.   [PS]  [id number] Lab results are normal except for slight chronic renal insufficiency which is approximately baseline according to prior results, vital signs remain normal.  Ultrasound of the leg is unremarkable.  Continue Keflex and doxycycline for an additional 7 days with primary care follow-up.  I will think there is any benefit to hospitalization or IV antibiotics at this time.  I think that his healing process may be delayed due to his chronic lymphedema.   [PS]    Clinical Course User Index [PS] Sharman CheekStafford, Timathy Newberry, MD     ____________________________________________   FINAL CLINICAL IMPRESSION(S) / ED DIAGNOSES    Final diagnoses:  Cellulitis of left lower extremity     ED Discharge Orders         Ordered    cephALEXin (KEFLEX) 500 MG capsule  3 times daily     02/24/19 1033    doxycycline (VIBRAMYCIN) 100 MG capsule  2 times daily     02/24/19 1033          Portions of this note were generated with dragon dictation software. Dictation errors may occur despite best attempts at proofreading.   Sharman CheekStafford, Shanyn Preisler, MD 02/24/19 1037

## 2019-02-24 NOTE — ED Triage Notes (Signed)
Left lower leg cellulitis x 1 week.  Has been taking Keflex and Doxycycline x 7 days.  Patient states symptoms have not improved.  In total, symptoms have been ongoing x 10 days.

## 2019-02-24 NOTE — ED Notes (Signed)
Pt c/o LLE swelling with redeness and pain for the past 10 days has been taking keflex and doxycycline and only has 2 days left, states it has improved but still have a lot of swelling and redness and is concerned he will need a longer course of abx,

## 2019-02-25 ENCOUNTER — Other Ambulatory Visit: Payer: Self-pay | Admitting: Urology

## 2019-02-25 MED ORDER — TESTOSTERONE CYPIONATE 200 MG/ML IM SOLN: 200.0000 mg | INTRAMUSCULAR | 0 refills | Status: DC

## 2019-03-31 ENCOUNTER — Telehealth: Payer: Self-pay | Admitting: Urology

## 2019-03-31 NOTE — Telephone Encounter (Signed)
Pt wants to know if he needs to have testosterone drawn before appt this Thursday w/Stoioff.  He said he could come in Thursday morning, if needed and keep appt for Thursday afternoon.  He had testosterone done 5/20.  Please let pt know.

## 2019-03-31 NOTE — Telephone Encounter (Signed)
Please advise if he needs a Testosterone check.

## 2019-04-01 NOTE — Telephone Encounter (Signed)
He will need a testosterone, hematocrit and PSA.  If he comes in Thursday morning the results will not be back by his appointment Thursday afternoon so we can just draw the labs at his appointment Thursday and contact him with the results.

## 2019-04-01 NOTE — Telephone Encounter (Signed)
Patient notified and voiced understanding.

## 2019-04-03 ENCOUNTER — Encounter: Payer: Self-pay | Admitting: Urology

## 2019-04-03 ENCOUNTER — Other Ambulatory Visit: Payer: Self-pay

## 2019-04-03 ENCOUNTER — Ambulatory Visit: Payer: BC Managed Care – PPO | Admitting: Urology

## 2019-04-03 VITALS — BP 132/82 | HR 99 | Ht 75.0 in | Wt 340.0 lb

## 2019-04-03 DIAGNOSIS — N5201 Erectile dysfunction due to arterial insufficiency: Secondary | ICD-10-CM

## 2019-04-03 DIAGNOSIS — E291 Testicular hypofunction: Secondary | ICD-10-CM | POA: Diagnosis not present

## 2019-04-03 NOTE — Progress Notes (Signed)
04/03/2019 3:21 PM   Rodney Howard 09-19-1965 [id number]  Referring provider: Rolm GalaGrandis, [name], MD [address] [place],  [place] [id number]  Chief Complaint  Patient presents with  . Hypogonadism    Urologic history: 1.  Hypogonadism  -On TRT testosterone cypionate  2.  Erectile dysfunction  -PDE 5 inhibitor   HPI: 53 y.o. male presents for annual follow-up.  He remains on testosterone cypionate 100 mg weekly.  He is due for an injection today.  He also takes anastrozole which had been described by a previous provider and he has continued.  He is also using sildenafil 20 mg which [facility].  He has good libido and energy level.  Denies bothersome lower urinary tract symptoms, breast tenderness/enlargement.  3533-month interim testosterone level and hematocrit looked good.   PMH: Past Medical History:  Diagnosis Date  . ADD (attention deficit disorder)   . Anxiety   . Bipolar 1 disorder (HCC)   . BPH (benign prostatic hyperplasia)   . DDD (degenerative disc disease), cervical   . Depression   . Erectile dysfunction   . GERD (gastroesophageal reflux disease)   . Gout   . HLD (hyperlipidemia)   . HTN (hypertension)   . Hypogonadism in male   . Lymphedema   . Nocturia   . Obesity   . Obesity   . Osteoarthritis   . Peripheral neuropathy    feet  . S/P insertion of spinal cord stimulator   . Sleep apnea    CPAP  . Tinea cruris     Surgical History: Past Surgical History:  Procedure Laterality Date  . BACK SURGERY    . CARDIAC CATHETERIZATION    . COLONOSCOPY WITH PROPOFOL N/A 06/25/2017   Procedure: COLONOSCOPY WITH PROPOFOL;  Surgeon: [name], [name], MD;  [facility];  Service: Endoscopy;  Laterality: N/A;  sleep apnea  . JOINT REPLACEMENT     x 3  . LUMBAR DISC SURGERY     x 3  . POLYPECTOMY N/A 06/25/2017   Procedure: POLYPECTOMY;  Surgeon: [name], [name], MD;  [facility];  Service:  Endoscopy;  Laterality: N/A;  . STOMACH SURGERY     gastric sleeve  . TOE SURGERY    . TOTAL KNEE ARTHROPLASTY Left 2014  . UMBILICAL HERNIA REPAIR N/A 02/17/2016   Procedure: HERNIA REPAIR UMBILICAL ADULT;  Surgeon: [name], MD;  Location: [facility];  Service: General;  Laterality: N/A;  . WRIST SURGERY      Home Medications:  Allergies as of 04/03/2019      Reactions   Atorvastatin Other (See Comments)   Leg pain and severe headaches   Demerol [meperidine]    Interacts with other medication, causes blood pressure to raise   Statins Other (See Comments)   Headaches/migraines   Other Swelling, Rash   dermabond      Medication List       Accurate as of April 03, 2019  3:21 PM. If you have any questions, ask your nurse or doctor.        STOP taking these medications   ALPRAZolam 0.5 MG tablet Commonly known as: Prudy FeelerXANAX Stopped by: [name] , MD   colchicine 0.6 MG tablet Stopped by: [name] , MD   hydrocortisone 2.5 % cream Stopped by: [name] , MD   KlonoPIN 1 MG tablet Generic drug: clonazePAM Stopped by: [name] , MD   modafinil 200 MG tablet Commonly known  as: PROVIGIL Stopped by: [name] , MD   multivitamin tablet Stopped by: [name] , MD   PreviDent 5000 Dry Mouth 1.1 % Gel dental gel Generic drug: sodium fluoride Stopped by: [name] , MD     TAKE these medications   Amantadine HCl 100 MG tablet Take 100 mg by mouth 2 (two) times daily.   anastrozole 1 MG tablet Commonly known as: ARIMIDEX Take 1 tablet (1 mg total) by mouth daily.   ARIPiprazole 15 MG tablet Commonly known as: ABILIFY Take 15 mg by mouth at bedtime. Reported on 09/22/2015   aspirin 81 MG chewable tablet Chew 81 mg by mouth daily.   BARIATRIC MULTIVITAMINS/IRON PO Take by mouth.   bumetanide 1 MG tablet Commonly known as: BUMEX Take 2 mg by mouth daily as needed (diuretic).   CALCIUM PO Take 1,500 mg by mouth daily.    Cholecalciferol 50 MCG (2000 UT) Tabs Take 2,000 Units by mouth daily. Reported on 02/03/2016   COLACE PO Take 100 mg by mouth 2 (two) times daily.   lamoTRIgine 200 MG tablet Commonly known as: LAMICTAL Take 200 mg by mouth 2 (two) times daily. Reported on 09/22/2015   lisinopril 20 MG tablet Commonly known as: ZESTRIL Take 20 mg by mouth daily.   metoprolol succinate 100 MG 24 hr tablet Commonly known as: TOPROL-XL Take 100 mg by mouth at bedtime.   oxyCODONE HCl 15 MG Taba Take 15 mg by mouth.   pantoprazole 40 MG tablet Commonly known as: PROTONIX Take 40 mg by mouth daily.   polycarbophil 625 MG tablet Commonly known as: FIBERCON Take 625 mg by mouth 2 (two) times daily.   Relief Knee Misc Use as directed   sildenafil 100 MG tablet Commonly known as: Viagra Take 1 tablet (100 mg total) by mouth as needed for erectile dysfunction.   testosterone cypionate 200 MG/ML injection Commonly known as: DEPOTESTOSTERONE CYPIONATE Inject 1 mL (200 mg total) into the muscle once a week.   tranylcypromine 10 MG tablet Commonly known as: PARNATE Take 30 mg by mouth 3 (three) times daily. 9 tablets daily   VITAMIN B-12 SL Place under the tongue daily.       Allergies:  Allergies  Allergen Reactions  . Atorvastatin Other (See Comments)    Leg pain and severe headaches  . Demerol [Meperidine]     Interacts with other medication, causes blood pressure to raise  . Statins Other (See Comments)    Headaches/migraines  . Other Swelling and Rash    dermabond    Family History: Family History  Problem Relation Age of Onset  . Arthritis Mother   . Deep vein thrombosis Mother   . Uterine cancer Mother   . Heart disease Mother   . Cirrhosis Sister   . Heart disease Father   . Diabetes Mellitus II Father        Mother, Sister  . Parkinson's disease Father   . Stroke Father   . Kidney disease Neg Hx   . Prostate cancer Neg Hx     Social History:  reports that he  has never smoked. He has never used smokeless tobacco. He reports current alcohol use of about 2.0 standard drinks of alcohol per week. He reports that he does not use drugs.  ROS: UROLOGY Frequent Urination?: No Hard to postpone urination?: No Burning/pain with urination?: No Get up at night to urinate?: No Leakage of urine?: No Urine stream starts and stops?: No Trouble starting  stream?: No Do you have to strain to urinate?: No Blood in urine?: No Urinary tract infection?: No Sexually transmitted disease?: No Injury to kidneys or bladder?: No Painful intercourse?: No Weak stream?: No Erection problems?: No Penile pain?: No  Gastrointestinal Nausea?: No Vomiting?: No Indigestion/heartburn?: No Diarrhea?: No Constipation?: No  Constitutional Fever: No Night sweats?: No Weight loss?: No Fatigue?: No  Skin Skin rash/lesions?: No Itching?: No  Eyes Blurred vision?: No Double vision?: No  Ears/Nose/Throat Sore throat?: No Sinus problems?: No  Hematologic/Lymphatic Swollen glands?: No Easy bruising?: No  Cardiovascular Leg swelling?: No Chest pain?: No  Respiratory Cough?: No Shortness of breath?: No  Endocrine Excessive thirst?: No  Musculoskeletal Back pain?: Yes Joint pain?: Yes  Neurological Headaches?: No Dizziness?: No  Psychologic Depression?: Yes Anxiety?: No  Physical Exam: BP 132/82 (BP Location: Left Arm, Patient Position: Sitting, Cuff Size: Large)   Pulse 99   Ht 6\' 3"  (1.905 m)   Wt (!) 340 lb (154.2 kg)   BMI 42.50 kg/m   Constitutional:  Alert and oriented, No acute distress. HEENT: [place] AT, moist mucus membranes.  Trachea midline, no masses. Cardiovascular: No clubbing, cyanosis, or edema. Respiratory: Normal respiratory effort, no increased work of breathing. GI: Abdomen is soft, nontender, nondistended, no abdominal masses GU: Prostate 30 g, smooth without nodules Lymph: No cervical or inguinal lymphadenopathy. Skin:  No rashes, bruises or suspicious lesions. Neurologic: Grossly intact, no focal deficits, moving all 4 extremities. Psychiatric: Normal mood and affect.   Assessment & Plan:    - Hypogonadism Doing well on TRT.  Testosterone, PSA and hematocrit were ordered and he will be notified with results.  If stable follow-up 6 months for testosterone/hematocrit and 1 year for office visit.  - Erectile dysfunction He was informed of the availability of generic tadalafil at an economical price with a good Rx coupon and desired to try.  Rx sent to pharmacy.   Abbie Sons, [place] [address] [place], [place] [phone]

## 2019-04-04 ENCOUNTER — Encounter: Payer: Self-pay | Admitting: Urology

## 2019-04-04 LAB — HEMATOCRIT: Hematocrit: 44.8 % (ref 37.5–51.0)

## 2019-04-04 LAB — ESTRADIOL: Estradiol: 6 pg/mL — ABNORMAL LOW (ref 7.6–42.6)

## 2019-04-04 LAB — PSA: Prostate Specific Ag, Serum: 1 ng/mL (ref 0.0–4.0)

## 2019-04-04 LAB — TESTOSTERONE: Testosterone: 738 ng/dL (ref 264–916)

## 2019-04-04 MED ORDER — TADALAFIL 20 MG PO TABS: ORAL_TABLET | ORAL | 0 refills | Status: DC

## 2019-04-05 ENCOUNTER — Encounter: Payer: Self-pay | Admitting: Urology

## 2019-04-05 DIAGNOSIS — N5201 Erectile dysfunction due to arterial insufficiency: Secondary | ICD-10-CM | POA: Insufficient documentation

## 2019-04-17 ENCOUNTER — Ambulatory Visit (INDEPENDENT_AMBULATORY_CARE_PROVIDER_SITE_OTHER): Payer: BC Managed Care – PPO | Admitting: Nurse Practitioner

## 2019-04-17 ENCOUNTER — Encounter (INDEPENDENT_AMBULATORY_CARE_PROVIDER_SITE_OTHER): Payer: Self-pay | Admitting: Nurse Practitioner

## 2019-04-17 ENCOUNTER — Other Ambulatory Visit: Payer: Self-pay

## 2019-04-17 VITALS — BP 120/77 | HR 88 | Resp 16 | Ht 75.0 in | Wt 335.8 lb

## 2019-04-17 DIAGNOSIS — M79605 Pain in left leg: Secondary | ICD-10-CM | POA: Diagnosis not present

## 2019-04-17 DIAGNOSIS — I83893 Varicose veins of bilateral lower extremities with other complications: Secondary | ICD-10-CM | POA: Diagnosis not present

## 2019-04-17 DIAGNOSIS — I10 Essential (primary) hypertension: Secondary | ICD-10-CM | POA: Diagnosis not present

## 2019-04-17 NOTE — Progress Notes (Signed)
SUBJECTIVE:  Patient ID: Rodney Howard, male    DOB: 1965/12/03, 53 y.o.   MRN: [id number] Chief Complaint  Patient presents with  . Follow-up    complications of past ablation,celluitis,left leg pain    HPI  Rodney Howard is a 53 y.o. male that presents today after an episode of cellulitis in his left lower extremity as well as having leg pain.  The patient recently underwent ablation of his left great saphenous vein in May.  At his follow-up visit on January 21, 2019 he was feeling well, his leg had healed with no evidence of cellulitis.  However subsequently a month later he developed cellulitis as well as extreme swelling and pain.  Initially he thought that it may be his knee due to multiple knee replacements in his left lower extremity however x-rays were done and there was no issue with his knee replacement.  The patient states that he has sharp pains near his shin in his calf as well as with ambulating.  He states that the pain is worse during the day.  The patient has chronic pain stemming from his back however he states that this pain is still not adequately controlled despite taking opioid analgesics on a daily basis.  He also states that the swelling improves just slightly in the morning but not greatly.  The patient has been wearing his medical grade 1 compression stockings on a daily basis and they are not adequately controlling swelling.  He denies any fever, chills, nausea, vomiting or diarrhea.  He denies any chest pain or shortness of breath.  Past Medical History:  Diagnosis Date  . ADD (attention deficit disorder)   . Anxiety   . Bipolar 1 disorder ([place])   . BPH (benign prostatic hyperplasia)   . DDD (degenerative disc disease), cervical   . Depression   . Erectile dysfunction   . GERD (gastroesophageal reflux disease)   . Gout   . HLD (hyperlipidemia)   . HTN (hypertension)   . Hypogonadism in male   . Lymphedema   . Nocturia   . Obesity   . Obesity   .  Osteoarthritis   . Peripheral neuropathy    feet  . S/P insertion of spinal cord stimulator   . Sleep apnea    CPAP  . Tinea cruris     Past Surgical History:  Procedure Laterality Date  . BACK SURGERY    . CARDIAC CATHETERIZATION    . COLONOSCOPY WITH PROPOFOL N/A 06/25/2017   Procedure: COLONOSCOPY WITH PROPOFOL;  Surgeon: Lucilla Lame, MD;  Location: [facility];  Service: Endoscopy;  Laterality: N/A;  sleep apnea  . JOINT REPLACEMENT     x 3  . LUMBAR DISC SURGERY     x 3  . POLYPECTOMY N/A 06/25/2017   Procedure: POLYPECTOMY;  Surgeon: Lucilla Lame, MD;  Location: [facility];  Service: Endoscopy;  Laterality: N/A;  . STOMACH SURGERY     gastric sleeve  . TOE SURGERY    . TOTAL KNEE ARTHROPLASTY Left 2014  . UMBILICAL HERNIA REPAIR N/A 02/17/2016   Procedure: HERNIA REPAIR UMBILICAL ADULT;  Surgeon: Florene Glen, MD;  Location: [facility];  Service: General;  Laterality: N/A;  . WRIST SURGERY      Social History   Socioeconomic History  . Marital status: Married    Spouse name: Not on file  . Number of children: Not on file  . Years of education: Not on file  .  Highest education level: Not on file  Occupational History  . Not on file  Social Needs  . Financial resource strain: Not on file  . Food insecurity    Worry: Not on file    Inability: Not on file  . Transportation needs    Medical: Not on file    Non-medical: Not on file  Tobacco Use  . Smoking status: Never Smoker  . Smokeless tobacco: Never Used  Substance and Sexual Activity  . Alcohol use: Yes    Alcohol/week: 2.0 standard drinks    Types: 2 Cans of beer per week    Comment: occasional  . Drug use: No  . Sexual activity: Yes  Lifestyle  . Physical activity    Days per week: Not on file    Minutes per session: Not on file  . Stress: Not on file  Relationships  . Social Musicianconnections    Talks on phone: Not on file    Gets together: Not on file    Attends religious  service: Not on file    Active member of club or organization: Not on file    Attends meetings of clubs or organizations: Not on file    Relationship status: Not on file  . Intimate partner violence    Fear of current or ex partner: Not on file    Emotionally abused: Not on file    Physically abused: Not on file    Forced sexual activity: Not on file  Other Topics Concern  . Not on file  Social History Narrative  . Not on file    Family History  Problem Relation Age of Onset  . Arthritis Mother   . Deep vein thrombosis Mother   . Uterine cancer Mother   . Heart disease Mother   . Cirrhosis Sister   . Heart disease Father   . Diabetes Mellitus II Father        Mother, Sister  . Parkinson's disease Father   . Stroke Father   . Kidney disease Neg Hx   . Prostate cancer Neg Hx     Allergies  Allergen Reactions  . Atorvastatin Other (See Comments)    Leg pain and severe headaches  . Demerol [Meperidine]     Interacts with other medication, causes blood pressure to raise  . Statins Other (See Comments)    Headaches/migraines  . Other Swelling and Rash    dermabond     Review of Systems   Review of Systems: Negative Unless Checked Constitutional: [] Weight loss  [] Fever  [] Chills Cardiac: [] Chest pain   []  Atrial Fibrillation  [] Palpitations   [] Shortness of breath when laying flat   [] Shortness of breath with exertion. [] Shortness of breath at rest Vascular:  [] Pain in legs with walking   [] Pain in legs with standing [] Pain in legs when laying flat   [] Claudication    [] Pain in feet when laying flat    [] History of DVT   [] Phlebitis   [x] Swelling in legs   [x] Varicose veins   [] Non-healing ulcers Pulmonary:   [] Uses home oxygen   [] Productive cough   [] Hemoptysis   [] Wheeze  [] COPD   [] Asthma Neurologic:  [] Dizziness   [] Seizures  [] Blackouts [] History of stroke   [] History of TIA  [] Aphasia   [] Temporary Blindness   [] Weakness or numbness in arm   [] Weakness or numbness  in leg Musculoskeletal:   [] Joint swelling   [] Joint pain   [] Low back pain  []  History of Knee  Replacement [] Arthritis [] back Surgeries  []  Spinal Stenosis    Hematologic:  [] Easy bruising  [] Easy bleeding   [] Hypercoagulable state   [] Anemic Gastrointestinal:  [] Diarrhea   [] Vomiting  [x] Gastroesophageal reflux/heartburn   [] Difficulty swallowing. [] Abdominal pain Genitourinary:  [] Chronic kidney disease   [] Difficult urination  [] Anuric   [] Blood in urine [] Frequent urination  [] Burning with urination   [] Hematuria Skin:  [] Rashes   [] Ulcers [] Wounds Psychological:  [] History of anxiety   [x]  History of major depression  []  Memory Difficulties      OBJECTIVE:   Physical Exam  BP 120/77 (BP Location: Right Arm)   Pulse 88   Resp 16   Ht 6\' 3"  (1.905 m)   Wt (!) 335 lb 12.8 oz (152.3 kg)   BMI 41.97 kg/m   Gen: WD/WN, NAD Head: [place]/AT, No temporalis wasting.  Ear/Nose/Throat: Hearing grossly intact, nares w/o erythema or drainage Eyes: PER, EOMI, sclera nonicteric.  Neck: Supple, no masses.  No JVD.  Pulmonary:  Good air movement, no use of accessory muscles.  Cardiac: RRR Vascular:  3+ pitting edema left lower extremity, 1+ right Vessel Right Left  Radial Palpable Palpable  Dorsalis Pedis Palpable Trace Palpable  Posterior Tibial Palpable Not Palpable   Gastrointestinal: soft, non-distended. No guarding/no peritoneal signs.  Musculoskeletal: M/S 5/5 throughout.  No deformity or atrophy.  Neurologic: Pain and light touch intact in extremities.  Symmetrical.  Speech is fluent. Motor exam as listed above. Psychiatric: Judgment intact, Mood & affect appropriate for pt's clinical situation. Dermatologic: Bilateral stasis dermatitis no Ulcers Noted.  No changes consistent with cellulitis. Lymph : No Cervical lymphadenopathy, no lichenification or skin changes of chronic lymphedema.       ASSESSMENT AND PLAN:  1. Varicose veins of bilateral lower extremities with other  complications Today we placed the patient in [place] wraps in order to better gain control the swelling.  The extreme swelling of his lower extremity may be a good cause of the pain.  I have advised the patient that if it becomes too painful during the course of the wrap he may remove the wrap however he should strive to keep it on for 1 week.  He is advised to continue to elevate his lower extremity as well as to exercise.  We will have the patient follow-up in 1 week with noninvasive studies to examine the status of the endovenous ablation as the patient may have recannulated or to determine if there are new issues within his left lower extremity. - VAS [place] LOWER EXTREMITY VENOUS REFLUX; Future  2. Pain of left lower extremity The pain in the patient's left lower extremity may be associated with the severe swelling as well as cellulitic changes however today I was not able to palpate a strong pulse in his left lower extremity.  In order to rule out all possible vascular causes of pain we will perform an ABI as well.  Patient does have multiple risk factors for peripheral artery disease. - VAS [place] ABI WITH/WO TBI; Future  3. Essential (primary) hypertension Continue antihypertensive medications as already ordered, these medications have been reviewed and there are no changes at this time.    Current Outpatient Medications on File Prior to Visit  Medication Sig Dispense Refill  . Amantadine HCl 100 MG tablet Take 100 mg by mouth 2 (two) times daily.     Marland Kitchen anastrozole (ARIMIDEX) 1 MG tablet Take 1 tablet (1 mg total) by mouth daily. 90 tablet 3  . ARIPiprazole (ABILIFY)  15 MG tablet Take 15 mg by mouth at bedtime. Reported on 09/22/2015    . aspirin 81 MG chewable tablet Chew 81 mg by mouth daily.     . bumetanide (BUMEX) 1 MG tablet Take 2 mg by mouth daily as needed (diuretic).     . CALCIUM PO Take 1,500 mg by mouth daily.    . Cholecalciferol 2000 UNITS TABS Take 2,000 Units by mouth daily. Reported  on 02/03/2016    . Cyanocobalamin (VITAMIN B-12 SL) Place under the tongue daily.    Tery Sanfilippo. Docusate Sodium (COLACE PO) Take 100 mg by mouth 2 (two) times daily.     Jae Dire. Elastic Bandages & Supports (RELIEF KNEE) MISC Use as directed    . lamoTRIgine (LAMICTAL) 200 MG tablet Take 200 mg by mouth 2 (two) times daily. Reported on 09/22/2015    . lisinopril (PRINIVIL,ZESTRIL) 20 MG tablet Take 20 mg by mouth daily.  3  . metoprolol succinate (TOPROL-XL) 100 MG 24 hr tablet Take 100 mg by mouth at bedtime.     . Multiple Vitamins-Minerals (BARIATRIC MULTIVITAMINS/IRON PO) Take by mouth.    . oxyCODONE HCl 15 MG TABA Take 15 mg by mouth.    . pantoprazole (PROTONIX) 40 MG tablet Take 40 mg by mouth daily.    . polycarbophil (FIBERCON) 625 MG tablet Take 625 mg by mouth 2 (two) times daily.    . sildenafil (VIAGRA) 100 MG tablet Take 1 tablet (100 mg total) by mouth as needed for erectile dysfunction. 88 tablet 3  . tadalafil (CIALIS) 20 MG tablet 1 tablet 30 minutes prior to intercourse 30 tablet 0  . testosterone cypionate (DEPOTESTOSTERONE CYPIONATE) 200 MG/ML injection Inject 1 mL (200 mg total) into the muscle once a week. 10 mL 0  . tranylcypromine (PARNATE) 10 MG tablet Take 30 mg by mouth 3 (three) times daily. 9 tablets daily     No current facility-administered medications on file prior to visit.     There are no Patient Instructions on file for this visit. No follow-ups on file.   Georgiana SpinnerFallon E Coen Miyasato, NP  This note was completed with Office managerDragon Dictation.  Any errors are purely unintentional.

## 2019-04-25 ENCOUNTER — Ambulatory Visit (INDEPENDENT_AMBULATORY_CARE_PROVIDER_SITE_OTHER): Payer: BC Managed Care – PPO

## 2019-04-25 ENCOUNTER — Ambulatory Visit (INDEPENDENT_AMBULATORY_CARE_PROVIDER_SITE_OTHER): Payer: BC Managed Care – PPO | Admitting: Nurse Practitioner

## 2019-04-25 ENCOUNTER — Other Ambulatory Visit: Payer: Self-pay

## 2019-04-25 ENCOUNTER — Encounter (INDEPENDENT_AMBULATORY_CARE_PROVIDER_SITE_OTHER): Payer: Self-pay | Admitting: Nurse Practitioner

## 2019-04-25 VITALS — BP 136/86 | HR 93 | Resp 14 | Ht 75.0 in | Wt 334.0 lb

## 2019-04-25 DIAGNOSIS — K219 Gastro-esophageal reflux disease without esophagitis: Secondary | ICD-10-CM

## 2019-04-25 DIAGNOSIS — M79605 Pain in left leg: Secondary | ICD-10-CM

## 2019-04-25 DIAGNOSIS — I83893 Varicose veins of bilateral lower extremities with other complications: Secondary | ICD-10-CM

## 2019-04-25 DIAGNOSIS — I10 Essential (primary) hypertension: Secondary | ICD-10-CM | POA: Diagnosis not present

## 2019-04-27 ENCOUNTER — Encounter (INDEPENDENT_AMBULATORY_CARE_PROVIDER_SITE_OTHER): Payer: Self-pay | Admitting: Nurse Practitioner

## 2019-04-27 NOTE — Progress Notes (Addendum)
SUBJECTIVE:  Patient ID: Rodney Howard, male    DOB: 1965-11-05, 54 y.o.   MRN: [id number] Chief Complaint  Patient presents with   Follow-up    HPI  Rodney Howard is a 53 y.o. male  presents today after an episode of cellulitis in his left lower extremity as well as having leg pain.  The patient recently underwent ablation of his left great saphenous vein in May.  At his follow-up visit on January 21, 2019 he was feeling well, his leg had healed with no evidence of cellulitis.  However subsequently a month later he developed cellulitis in his left lower extremity as well as extreme swelling and pain.  Initially he thought that it may be his knee due to multiple knee replacements in his left lower extremity however x-rays were done and there was no issue with his knee replacement.  The patient states that he has sharp pains near his shin in his calf as well as with ambulating.  He states that the pain is worse during the day.  The patient has chronic pain stemming from his back however he states that this pain is still not adequately controlled despite taking opioid analgesics on a daily basis.  He also states that the swelling improves just slightly in the morning but not greatly.  The patient has been wearing his medical grade 1 compression stockings on a daily basis and they are not adequately controlling swelling.  He denies any fever, chills, nausea, vomiting or diarrhea.  He denies any chest pain or shortness of breath.  Noninvasive studies show a Baker's cyst behind the left knee.  There is also reflux seen within the left saphenofemoral junction and great saphenous vein proximal thigh.  The area within the proximal thigh extending 7 to 8 cm.  There is no evidence of DVT or superficial venous thrombosis.  Past Medical History:  Diagnosis Date   ADD (attention deficit disorder)    Anxiety    Bipolar 1 disorder (HCC)    BPH (benign prostatic hyperplasia)    DDD (degenerative disc  disease), cervical    Depression    Erectile dysfunction    GERD (gastroesophageal reflux disease)    Gout    HLD (hyperlipidemia)    HTN (hypertension)    Hypogonadism in male    Lymphedema    Nocturia    Obesity    Obesity    Osteoarthritis    Peripheral neuropathy    feet   S/P insertion of spinal cord stimulator    Sleep apnea    CPAP   Tinea cruris     Past Surgical History:  Procedure Laterality Date   BACK SURGERY     CARDIAC CATHETERIZATION     COLONOSCOPY WITH PROPOFOL N/A 06/25/2017   Procedure: COLONOSCOPY WITH PROPOFOL;  Surgeon: Midge Minium, MD;  Location: [facility];  Service: Endoscopy;  Laterality: N/A;  sleep apnea   JOINT REPLACEMENT     x 3   LUMBAR DISC SURGERY     x 3   POLYPECTOMY N/A 06/25/2017   Procedure: POLYPECTOMY;  Surgeon: Midge Minium, MD;  Location: [facility];  Service: Endoscopy;  Laterality: N/A;   STOMACH SURGERY     gastric sleeve   TOE SURGERY     TOTAL KNEE ARTHROPLASTY Left 2014   UMBILICAL HERNIA REPAIR N/A 02/17/2016   Procedure: HERNIA REPAIR UMBILICAL ADULT;  Surgeon: Lattie Haw, MD;  Location: [facility];  Service: General;  Laterality:  N/A;   WRIST SURGERY      Social History   Socioeconomic History   Marital status: Married    Spouse name: Not on file   Number of children: Not on file   Years of education: Not on file   Highest education level: Not on file  Occupational History   Not on file  Social Needs   Financial resource strain: Not on file   Food insecurity    Worry: Not on file    Inability: Not on file   Transportation needs    Medical: Not on file    Non-medical: Not on file  Tobacco Use   Smoking status: Never Smoker   Smokeless tobacco: Never Used  Substance and Sexual Activity   Alcohol use: Yes    Alcohol/week: 2.0 standard drinks    Types: 2 Cans of beer per week    Comment: occasional   Drug use: No   Sexual activity: Yes    Lifestyle   Physical activity    Days per week: Not on file    Minutes per session: Not on file   Stress: Not on file  Relationships   Social connections    Talks on phone: Not on file    Gets together: Not on file    Attends religious service: Not on file    Active member of club or organization: Not on file    Attends meetings of clubs or organizations: Not on file    Relationship status: Not on file   Intimate partner violence    Fear of current or ex partner: Not on file    Emotionally abused: Not on file    Physically abused: Not on file    Forced sexual activity: Not on file  Other Topics Concern   Not on file  Social History Narrative   Not on file    Family History  Problem Relation Age of Onset   Arthritis Mother    Deep vein thrombosis Mother    Uterine cancer Mother    Heart disease Mother    Cirrhosis Sister    Heart disease Father    Diabetes Mellitus II Father        Mother, Sister   Parkinson's disease Father    Stroke Father    Kidney disease Neg Hx    Prostate cancer Neg Hx     Allergies  Allergen Reactions   Atorvastatin Other (See Comments)    Leg pain and severe headaches   Demerol [Meperidine]     Interacts with other medication, causes blood pressure to raise   Statins Other (See Comments)    Headaches/migraines   Other Swelling and Rash    dermabond     Review of Systems   Review of Systems: Negative Unless Checked Constitutional: [] Weight loss  [] Fever  [] Chills Cardiac: [] Chest pain   []  Atrial Fibrillation  [] Palpitations   [] Shortness of breath when laying flat   [] Shortness of breath with exertion. [] Shortness of breath at rest Vascular:  [] Pain in legs with walking   [] Pain in legs with standing [] Pain in legs when laying flat   [] Claudication    [] Pain in feet when laying flat    [] History of DVT   [] Phlebitis   [x] Swelling in legs   [x] Varicose veins   [] Non-healing ulcers Pulmonary:   [] Uses home oxygen    [] Productive cough   [] Hemoptysis   [] Wheeze  [] COPD   [] Asthma Neurologic:  [] Dizziness   [] Seizures  []   Blackouts [] History of stroke   [] History of TIA  [] Aphasia   [] Temporary Blindness   [] Weakness or numbness in arm   [] Weakness or numbness in leg Musculoskeletal:   [] Joint swelling   [] Joint pain   [] Low back pain  [x]  History of Knee Replacement [] Arthritis [] back Surgeries  []  Spinal Stenosis    Hematologic:  [] Easy bruising  [] Easy bleeding   [] Hypercoagulable state   [] Anemic Gastrointestinal:  [] Diarrhea   [] Vomiting  [x] Gastroesophageal reflux/heartburn   [] Difficulty swallowing. [] Abdominal pain Genitourinary:  [] Chronic kidney disease   [] Difficult urination  [] Anuric   [] Blood in urine [] Frequent urination  [] Burning with urination   [] Hematuria Skin:  [] Rashes   [] Ulcers [] Wounds Psychological:  [] History of anxiety   [x]  History of major depression  []  Memory Difficulties      OBJECTIVE:   Physical Exam  BP 136/86 (BP Location: Left Arm, Patient Position: Sitting, Cuff Size: Large)    Pulse 93    Resp 14    Ht 6\' 3"  (1.905 m)    Wt (!) 334 lb (151.5 kg)    BMI 41.75 kg/m   Gen: WD/WN, NAD Head: [place]/AT, No temporalis wasting.  Ear/Nose/Throat: Hearing grossly intact, nares w/o erythema or drainage Eyes: PER, EOMI, sclera nonicteric.  Neck: Supple, no masses.  No JVD.  Pulmonary:  Good air movement, no use of accessory muscles.  Cardiac: RRR Vascular:  Trace edema right lower extremity, 3+ edema left Vessel Right Left  Radial Palpable Palpable  Dorsalis Pedis Palpable Palpable  Posterior Tibial Palpable Palpable   Gastrointestinal: soft, non-distended. No guarding/no peritoneal signs.  Musculoskeletal: M/S 5/5 throughout.  No deformity or atrophy.  Neurologic: Pain and light touch intact in extremities.  Symmetrical.  Speech is fluent. Motor exam as listed above. Psychiatric: Judgment intact, Mood & affect appropriate for pt's clinical situation. Dermatologic: No  Venous rashes. No Ulcers Noted.  No changes consistent with cellulitis. Lymph : No Cervical lymphadenopathy, no lichenification or skin changes of chronic lymphedema.       ASSESSMENT AND PLAN:  1. Varicose veins of bilateral lower extremities with other complications Recommend:  The patient has had successful ablation of the previously incompetent saphenous venous system but still has persistent symptoms of pain and swelling that are having a negative impact on daily life and daily activities.  Noninvasive studies show a 7 to 8 cm proximal segment that remains incompetent of the left great saphenous vein.   Patient should undergo injection foam sclerotherapy of the left lower extremity to treat the residual varicosities.  The risks, benefits and alternative therapies were reviewed in detail with the patient.  All questions were answered.  The patient agrees to proceed with foam sclerotherapy at their convenience.  The patient will continue wearing the graduated compression stockings and using the over-the-counter pain medications to treat her symptoms.       2. Essential (primary) hypertension Continue antihypertensive medications as already ordered, these medications have been reviewed and there are no changes at this time.   3. Gastroesophageal reflux disease, esophagitis presence not specified Continue PPI as already ordered, this medication has been reviewed and there are no changes at this time.  Avoidence of caffeine and alcohol  Moderate elevation of the head of the bed    Current Outpatient Medications on File Prior to Visit  Medication Sig Dispense Refill   Amantadine HCl 100 MG tablet Take 100 mg by mouth 2 (two) times daily.      anastrozole (ARIMIDEX) 1  MG tablet Take 1 tablet (1 mg total) by mouth daily. 90 tablet 3   ARIPiprazole (ABILIFY) 15 MG tablet Take 15 mg by mouth at bedtime. Reported on 09/22/2015     aspirin 81 MG chewable tablet Chew 81 mg by mouth  daily.      bumetanide (BUMEX) 1 MG tablet Take 2 mg by mouth daily as needed (diuretic).      CALCIUM PO Take 1,500 mg by mouth daily.     cephALEXin (KEFLEX) 500 MG capsule Take 500 mg by mouth 2 (two) times daily.     Cholecalciferol 2000 UNITS TABS Take 2,000 Units by mouth daily. Reported on 02/03/2016     Cyanocobalamin (VITAMIN B-12 SL) Place under the tongue daily.     Docusate Sodium (COLACE PO) Take 100 mg by mouth 2 (two) times daily.      Elastic Bandages & Supports (RELIEF KNEE) MISC Use as directed     lamoTRIgine (LAMICTAL) 200 MG tablet Take 200 mg by mouth 2 (two) times daily. Reported on 09/22/2015     lisinopril (PRINIVIL,ZESTRIL) 20 MG tablet Take 20 mg by mouth daily.  3   metoprolol succinate (TOPROL-XL) 100 MG 24 hr tablet Take 100 mg by mouth at bedtime.      Multiple Vitamins-Minerals (BARIATRIC MULTIVITAMINS/IRON PO) Take by mouth.     oxyCODONE HCl 15 MG TABA Take 15 mg by mouth.     pantoprazole (PROTONIX) 40 MG tablet Take 40 mg by mouth daily.     polycarbophil (FIBERCON) 625 MG tablet Take 625 mg by mouth 2 (two) times daily.     sildenafil (VIAGRA) 100 MG tablet Take 1 tablet (100 mg total) by mouth as needed for erectile dysfunction. 88 tablet 3   tadalafil (CIALIS) 20 MG tablet 1 tablet 30 minutes prior to intercourse 30 tablet 0   testosterone cypionate (DEPOTESTOSTERONE CYPIONATE) 200 MG/ML injection Inject 1 mL (200 mg total) into the muscle once a week. 10 mL 0   tranylcypromine (PARNATE) 10 MG tablet Take 30 mg by mouth 3 (three) times daily. 9 tablets daily     No current facility-administered medications on file prior to visit.     There are no Patient Instructions on file for this visit. No follow-ups on file.   Georgiana SpinnerFallon E Soriya Worster, NP  This note was completed with Office managerDragon Dictation.  Any errors are purely unintentional.

## 2019-04-29 ENCOUNTER — Other Ambulatory Visit: Payer: Self-pay | Admitting: Urology

## 2019-04-29 ENCOUNTER — Encounter: Payer: Self-pay | Admitting: Urology

## 2019-04-29 ENCOUNTER — Encounter (INDEPENDENT_AMBULATORY_CARE_PROVIDER_SITE_OTHER): Payer: Self-pay

## 2019-04-29 DIAGNOSIS — E291 Testicular hypofunction: Secondary | ICD-10-CM

## 2019-04-30 ENCOUNTER — Telehealth (INDEPENDENT_AMBULATORY_CARE_PROVIDER_SITE_OTHER): Payer: Self-pay | Admitting: Nurse Practitioner

## 2019-04-30 MED ORDER — TESTOSTERONE CYPIONATE 200 MG/ML IM SOLN: 200.0000 mg | INTRAMUSCULAR | 0 refills | Status: DC

## 2019-04-30 NOTE — Telephone Encounter (Signed)
Patient sent message via mychart asking to be contacted to advise him on the next step with his varicose veins... I looked in the patients last visit, 04/25/19, and advised patient the next step would be foam sclerotherapy and that the sclero had to have prior auth through his insurance and that we would contact him to schedule when this was completed. Patient verbalized understanding. AS, CMA

## 2019-05-02 ENCOUNTER — Other Ambulatory Visit: Payer: Self-pay | Admitting: Urology

## 2019-05-02 MED ORDER — SILDENAFIL CITRATE 100 MG PO TABS: 100.0000 mg | ORAL_TABLET | ORAL | 0 refills | Status: DC | PRN

## 2019-05-07 ENCOUNTER — Telehealth: Payer: Self-pay

## 2019-05-07 ENCOUNTER — Other Ambulatory Visit (INDEPENDENT_AMBULATORY_CARE_PROVIDER_SITE_OTHER): Payer: Self-pay | Admitting: Nurse Practitioner

## 2019-05-07 ENCOUNTER — Encounter (INDEPENDENT_AMBULATORY_CARE_PROVIDER_SITE_OTHER): Payer: Self-pay

## 2019-05-07 DIAGNOSIS — E291 Testicular hypofunction: Secondary | ICD-10-CM

## 2019-05-07 DIAGNOSIS — N5201 Erectile dysfunction due to arterial insufficiency: Secondary | ICD-10-CM

## 2019-05-07 MED ORDER — SILDENAFIL CITRATE 100 MG PO TABS: 100.0000 mg | ORAL_TABLET | ORAL | 6 refills | Status: DC | PRN

## 2019-05-07 NOTE — Telephone Encounter (Signed)
RX sent again for Sildenafil as 1st RX was printed and not faxed to the pharmacy.

## 2019-05-19 ENCOUNTER — Other Ambulatory Visit: Payer: Self-pay | Admitting: Orthopedic Surgery

## 2019-05-19 DIAGNOSIS — Z96652 Presence of left artificial knee joint: Secondary | ICD-10-CM

## 2019-05-21 ENCOUNTER — Telehealth: Payer: Self-pay

## 2019-05-21 NOTE — Telephone Encounter (Signed)
Incoming notice of approval from [facility] for Testosterone 200mg /mL. Approval dates 05/20/2019 - 05/19/2022.  Pt informed

## 2019-05-22 ENCOUNTER — Ambulatory Visit
Admission: RE | Admit: 2019-05-22 | Discharge: 2019-05-22 | Disposition: A | Discharge status: Home / Self Care | Payer: BC Managed Care – PPO | Source: Ambulatory Visit | Attending: Orthopedic Surgery | Admitting: Orthopedic Surgery

## 2019-05-22 ENCOUNTER — Other Ambulatory Visit: Payer: Self-pay

## 2019-05-22 DIAGNOSIS — Z96652 Presence of left artificial knee joint: Secondary | ICD-10-CM | POA: Diagnosis not present

## 2019-05-22 LAB — POCT I-STAT CREATININE: Creatinine, Ser: 1.4 mg/dL — ABNORMAL HIGH (ref 0.61–1.24)

## 2019-05-22 MED ORDER — GADOBUTROL 1 MMOL/ML IV SOLN
10.0000 mL | Freq: Once | INTRAVENOUS | Status: AC | PRN
  Administered 2019-05-22: 12:00:00 10 mL via INTRAVENOUS

## 2019-05-30 ENCOUNTER — Ambulatory Visit: Payer: BC Managed Care – PPO

## 2019-06-10 ENCOUNTER — Other Ambulatory Visit: Payer: Self-pay | Admitting: Orthopedic Surgery

## 2019-06-10 DIAGNOSIS — Z96652 Presence of left artificial knee joint: Secondary | ICD-10-CM

## 2019-06-11 ENCOUNTER — Other Ambulatory Visit: Payer: Self-pay

## 2019-06-11 ENCOUNTER — Encounter
Admission: RE | Admit: 2019-06-11 | Discharge: 2019-06-11 | Disposition: A | Discharge status: Home / Self Care | Payer: BC Managed Care – PPO | Source: Ambulatory Visit | Attending: Orthopedic Surgery | Admitting: Orthopedic Surgery

## 2019-06-11 DIAGNOSIS — Z96652 Presence of left artificial knee joint: Secondary | ICD-10-CM | POA: Insufficient documentation

## 2019-06-11 MED ORDER — TECHNETIUM TC 99M MEDRONATE IV KIT
20.0000 | PACK | Freq: Once | INTRAVENOUS | Status: AC | PRN
  Administered 2019-06-11: 23.91 via INTRAVENOUS

## 2019-07-06 ENCOUNTER — Other Ambulatory Visit: Payer: Self-pay | Admitting: Urology

## 2019-07-07 ENCOUNTER — Encounter: Payer: Self-pay | Admitting: Urology

## 2019-07-15 ENCOUNTER — Other Ambulatory Visit: Payer: Self-pay

## 2019-07-15 DIAGNOSIS — E291 Testicular hypofunction: Secondary | ICD-10-CM

## 2019-07-22 ENCOUNTER — Other Ambulatory Visit: Payer: Self-pay

## 2019-07-22 ENCOUNTER — Other Ambulatory Visit: Payer: BC Managed Care – PPO

## 2019-07-22 DIAGNOSIS — E291 Testicular hypofunction: Secondary | ICD-10-CM

## 2019-07-23 ENCOUNTER — Telehealth: Payer: Self-pay | Admitting: Urology

## 2019-07-23 LAB — TESTOSTERONE: Testosterone: 1318 ng/dL — ABNORMAL HIGH (ref 264–916)

## 2019-07-23 NOTE — Progress Notes (Signed)
error 

## 2019-07-23 NOTE — Telephone Encounter (Signed)
This patient had requested to increase his testosterone dose.  His testosterone level was >1300.  Will be unable to increase his dose and may need to decrease.  When was his last injection in relation to this blood draw?

## 2019-07-23 NOTE — Telephone Encounter (Signed)
See my chart message

## 2019-07-24 ENCOUNTER — Ambulatory Visit (INDEPENDENT_AMBULATORY_CARE_PROVIDER_SITE_OTHER): Payer: BC Managed Care – PPO | Admitting: Nurse Practitioner

## 2019-07-24 ENCOUNTER — Other Ambulatory Visit: Payer: Self-pay | Admitting: Urology

## 2019-07-24 ENCOUNTER — Other Ambulatory Visit: Payer: Self-pay

## 2019-07-24 DIAGNOSIS — E291 Testicular hypofunction: Secondary | ICD-10-CM

## 2019-07-24 MED ORDER — TESTOSTERONE CYPIONATE 200 MG/ML IM SOLN: 160.0000 mg | INTRAMUSCULAR | 0 refills | Status: DC

## 2019-07-28 ENCOUNTER — Other Ambulatory Visit: Payer: Self-pay | Admitting: Urology

## 2019-07-28 DIAGNOSIS — E291 Testicular hypofunction: Secondary | ICD-10-CM

## 2019-07-28 NOTE — Telephone Encounter (Signed)
Called pharmacy, they state that they did not receive the rx that was sent in on 12/10. Please resend. Thanks

## 2019-09-02 ENCOUNTER — Other Ambulatory Visit: Payer: Self-pay

## 2019-09-02 ENCOUNTER — Other Ambulatory Visit: Payer: BC Managed Care – PPO

## 2019-09-02 DIAGNOSIS — E291 Testicular hypofunction: Secondary | ICD-10-CM

## 2019-09-03 ENCOUNTER — Encounter: Payer: Self-pay | Admitting: Urology

## 2019-09-03 LAB — TESTOSTERONE: Testosterone: 832 ng/dL (ref 264–916)

## 2019-09-03 NOTE — Telephone Encounter (Signed)
-----   Message from [name], MD sent at 09/03/2019  8:32 AM EST ----- Repeat testosterone level looks good at 832

## 2019-09-08 ENCOUNTER — Other Ambulatory Visit: Payer: Self-pay | Admitting: Urology

## 2019-09-08 DIAGNOSIS — E291 Testicular hypofunction: Secondary | ICD-10-CM

## 2019-09-08 MED ORDER — TESTOSTERONE CYPIONATE 200 MG/ML IM SOLN: INTRAMUSCULAR | 0 refills | Status: DC

## 2019-09-23 ENCOUNTER — Other Ambulatory Visit (HOSPITAL_COMMUNITY): Payer: Self-pay | Admitting: Orthopedic Surgery

## 2019-09-23 ENCOUNTER — Other Ambulatory Visit: Payer: Self-pay | Admitting: Orthopedic Surgery

## 2019-09-23 DIAGNOSIS — M65872 Other synovitis and tenosynovitis, left ankle and foot: Secondary | ICD-10-CM

## 2019-09-28 ENCOUNTER — Other Ambulatory Visit: Payer: Self-pay

## 2019-09-28 ENCOUNTER — Ambulatory Visit
Admission: RE | Admit: 2019-09-28 | Discharge: 2019-09-28 | Disposition: A | Discharge status: Home / Self Care | Payer: BC Managed Care – PPO | Source: Ambulatory Visit | Attending: Orthopedic Surgery | Admitting: Orthopedic Surgery

## 2019-09-28 DIAGNOSIS — M65872 Other synovitis and tenosynovitis, left ankle and foot: Secondary | ICD-10-CM

## 2019-10-07 ENCOUNTER — Other Ambulatory Visit: Payer: BC Managed Care – PPO

## 2019-10-11 ENCOUNTER — Other Ambulatory Visit: Payer: Self-pay | Admitting: Urology

## 2019-11-09 ENCOUNTER — Encounter: Payer: Self-pay | Admitting: Urology

## 2019-11-09 DIAGNOSIS — N5201 Erectile dysfunction due to arterial insufficiency: Secondary | ICD-10-CM

## 2019-11-19 ENCOUNTER — Telehealth: Payer: Self-pay | Admitting: *Deleted

## 2019-11-19 NOTE — Telephone Encounter (Signed)
Talked to patient and advised him that we cant send in Sildenafil to [email or web address] . Patient understands. We can write him a Sildenafil #90 with 3 refills and he can take it to the pharmacy that he choices.

## 2019-11-21 ENCOUNTER — Encounter: Payer: Self-pay | Admitting: Urology

## 2019-11-21 MED ORDER — SILDENAFIL CITRATE 20 MG PO TABS: ORAL_TABLET | ORAL | 3 refills | Status: DC

## 2019-11-21 NOTE — Telephone Encounter (Signed)
Pt called office this morning and needs to come by and pick up an RX printed for Sildenafil w/3 refills.

## 2019-11-21 NOTE — Telephone Encounter (Signed)
Script printed and placed up front for pick up

## 2019-11-25 ENCOUNTER — Other Ambulatory Visit: Payer: Self-pay | Admitting: *Deleted

## 2019-11-25 DIAGNOSIS — N5201 Erectile dysfunction due to arterial insufficiency: Secondary | ICD-10-CM

## 2019-11-25 MED ORDER — SILDENAFIL CITRATE 100 MG PO TABS: 100.0000 mg | ORAL_TABLET | ORAL | 0 refills | Status: DC | PRN

## 2019-11-25 NOTE — Progress Notes (Signed)
Patient is going to bring back the other script that was printed for Silenafil 20 mg

## 2019-11-25 NOTE — Addendum Note (Signed)
Addended by: Levada Schilling on: 11/25/2019 03:44 PM   Modules accepted: Orders

## 2019-11-25 NOTE — Telephone Encounter (Signed)
Attempted to reach patient to discuss medication request, left message to return call

## 2019-12-16 IMAGING — MR MR FEMUR*L* WO/W CM
11 series · 40 of 40 positions shown · IV contrast (gadavist)
Comparison: None.

CLINICAL DATA: Anterior left thigh swelling and redness for 2
months. Cellulitis.

EXAM:
MR OF THE LEFT LOWER EXTREMITY WITHOUT AND WITH CONTRAST
TECHNIQUE: Multiplanar, multisequence MR imaging of the left femur was
performed both before and after administration of intravenous
contrast.
CONTRAST:  10mL GADAVIST GADOBUTROL 1 MMOL/ML IV SOLN

[Series 7: composed cor t1_comp_filt · coronal · left · 5.6mm · 1.08mm/px · 2 of 34 slices shown]
[im 1/34]
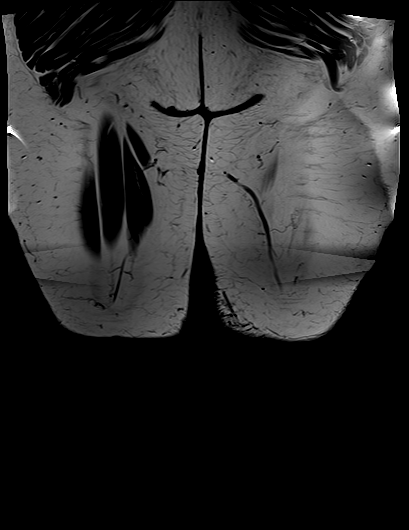
[im 34/34]
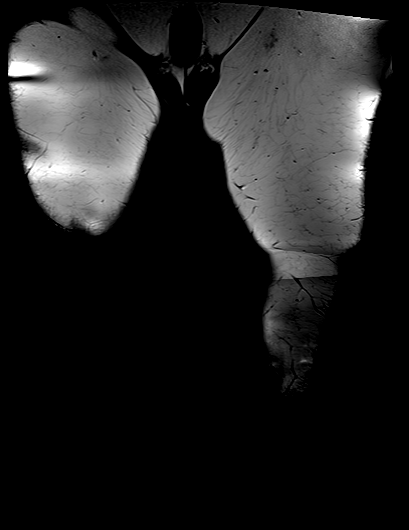

[Series 11: composed cor stir_comp_filt · coronal · left · 5.6mm · 1.08mm/px · 2 of 34 slices shown]
[im 1/34]
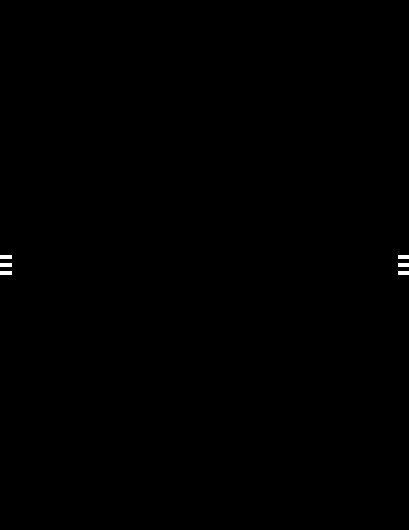
[im 34/34]
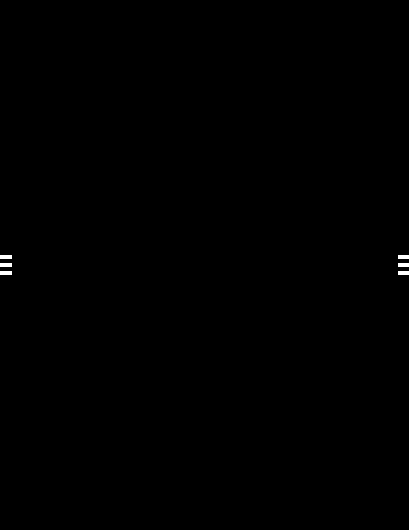

[Series 17: ax t1_comp · axial · left · 5.0mm · 0.86mm/px · z∈[-287,+283]mm · 6 of 96 slices shown]
[im 1/96]
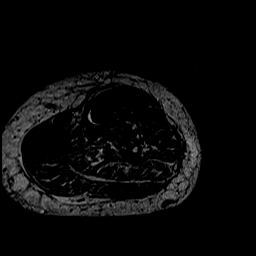
[im 20/96]
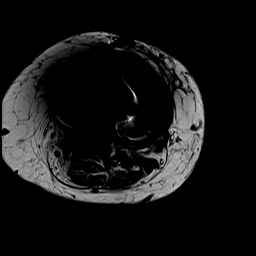
[im 39/96]
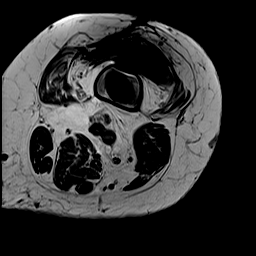
[im 58/96]
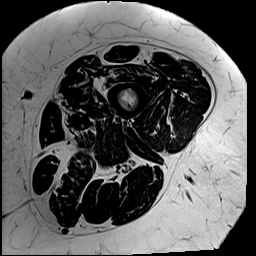
[im 77/96]
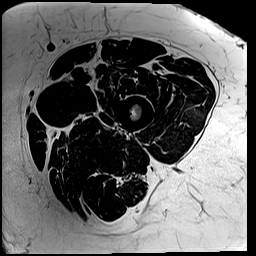
[im 96/96]
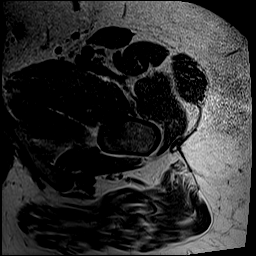

[Series 20: T2 · axial · left · 5.0mm · 0.86mm/px · z∈[-287,+283]mm · 6 of 96 slices shown]
[im 1/96]
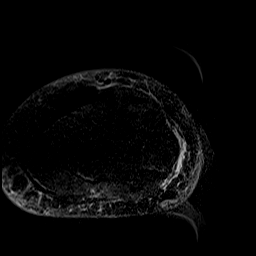
[im 20/96]
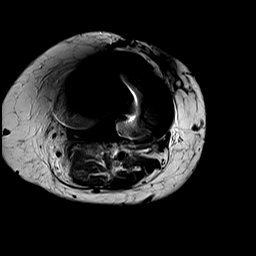
[im 39/96]
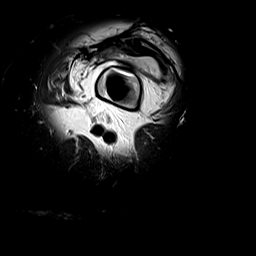
[im 58/96]
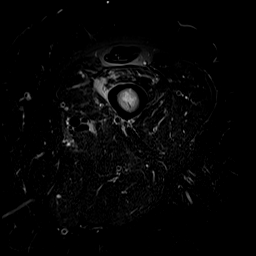
[im 77/96]
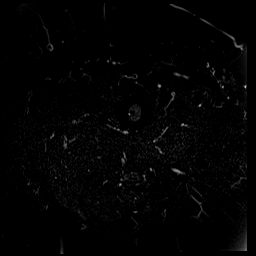
[im 96/96]
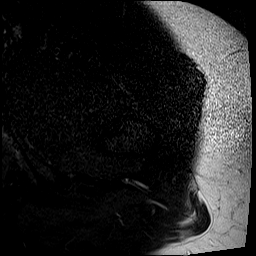

[Series 24: t2_tse_sag fs_comp · sagittal · left · 5.6mm · 1.05mm/px · 2 of 36 slices shown]
[im 1/36]
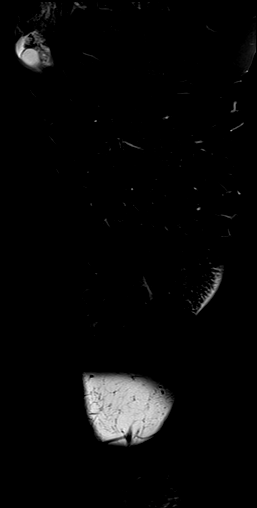
[im 36/36]
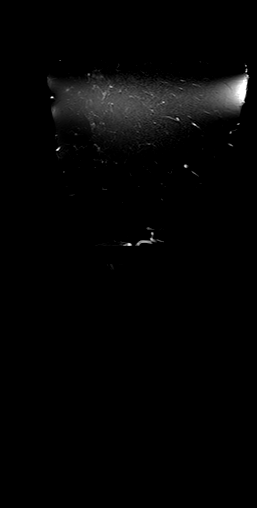

[Series 25: STIR · axial · left · 5.0mm · 0.69mm/px · z∈[-287,-5]mm · 3 of 48 slices shown (1 of 3)]
[im 1/48]
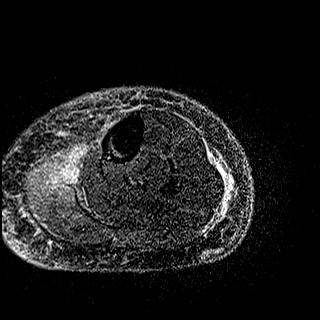
[im 24/48]
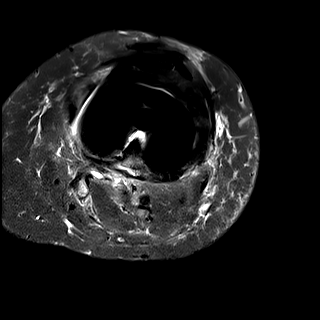
[im 48/48]
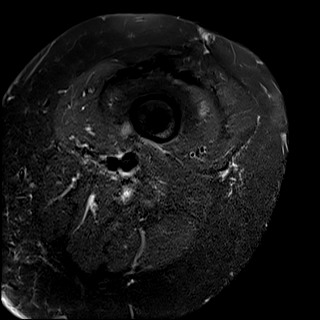

[Series 26: STIR · axial · left · 5.0mm · 0.69mm/px · z∈[-11,+271]mm · 3 of 48 slices shown (2 of 3)]
[im 1/48]
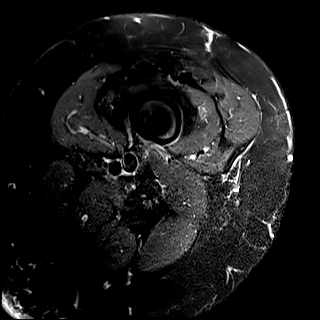
[im 24/48]
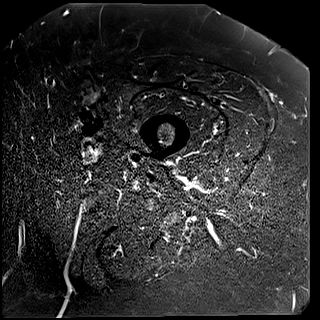
[im 48/48]
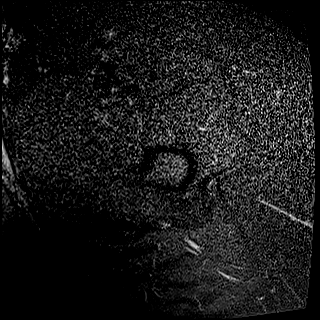

[Series 31: T1 fat-sat · axial · left · 5.0mm · 0.86mm/px · z∈[-287,+283]mm · 6 of 96 slices shown (1 of 3)]
[im 1/96]
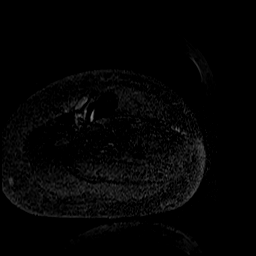
[im 20/96]
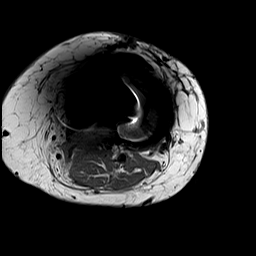
[im 39/96]
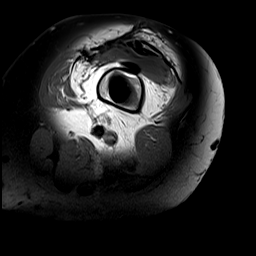
[im 58/96]
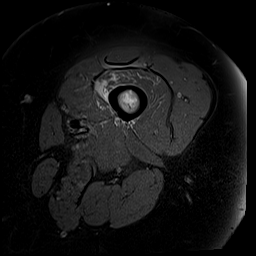
[im 77/96]
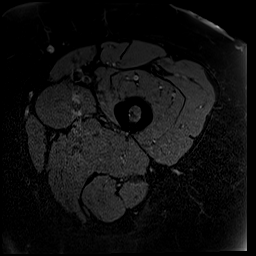
[im 96/96]
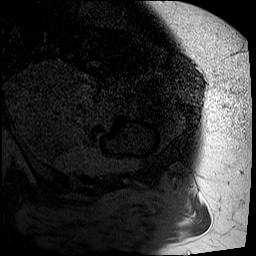

[Series 34: T1 fat-sat · axial · left · 5.0mm · 0.86mm/px · z∈[-287,+283]mm · 6 of 96 slices shown (2 of 3)]
[im 1/96]
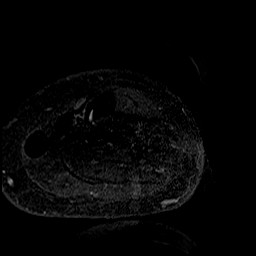
[im 20/96]
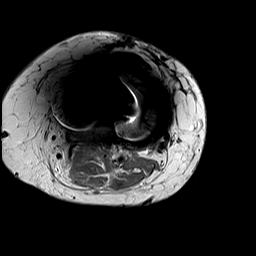
[im 39/96]
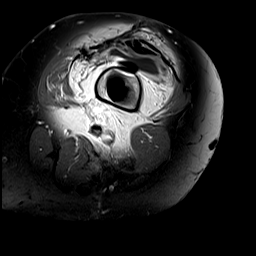
[im 58/96]
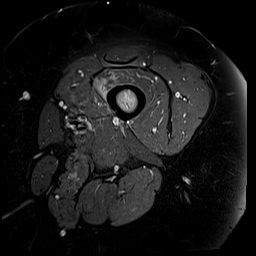
[im 77/96]
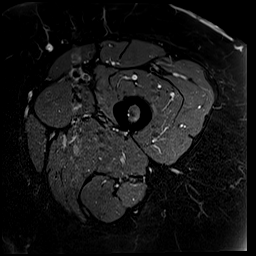
[im 96/96]
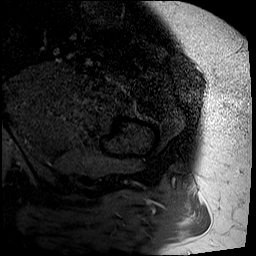

[Series 37: T1 fat-sat · sagittal · left · 5.6mm · 1.05mm/px · 2 of 36 slices shown (3 of 3)]
[im 1/36]
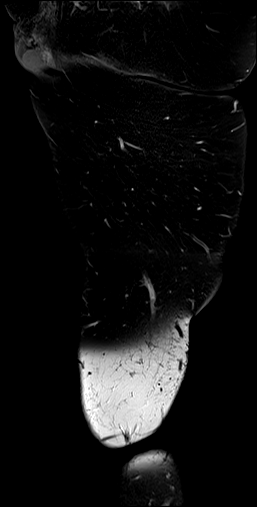
[im 36/36]
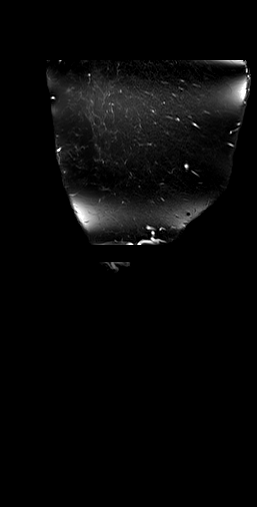

[Series 1010: STIR · sagittal · left · 4.5mm · 0.94mm/px · 2 of 36 slices shown (3 of 3)]
[im 1/36]
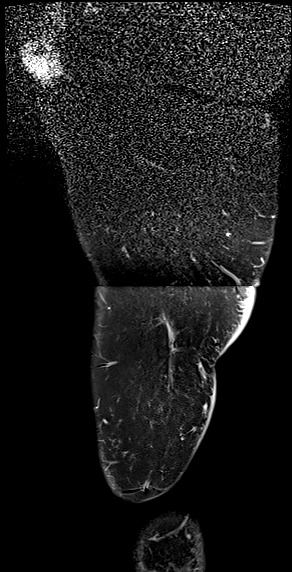
[im 36/36]
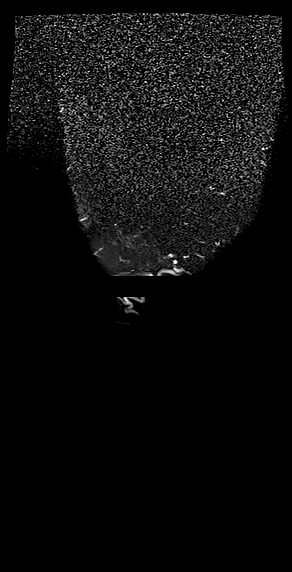

[40 of 40 positions shown; findings below may reference images not displayed]

FINDINGS: Bones/Joint/Cartilage

Susceptibility artifact from left total knee arthroplasty degrades
evaluation of the adjacent bone and soft tissues. Within these
limitations, no acute fracture. No malalignment. No bone lesion or
bone signal abnormality. Small to moderate sized left knee joint
effusion with synovial hypertrophy.

Ligaments

Visualized ligaments appear intact.

Soft tissues

Linear low signal intensity scarring overlies the anterior of the
knee with extensive punctate foci of susceptibility compatible prior
surgical scar. There is a multiloculated fluid collection within the
posterior soft tissues of the visualized proximal calf with a small
neck arising between the medial head of the gastrocnemius and
semimembranosus tendons (series 25, image 25). It is difficult to
determine if each of the fluid pockets are in communication. This
collection extends at least 9 cm in craniocaudal dimension, however
the inferior extent extends beyond the field of view. Mild
circumferential subcutaneous edema proximal calf with a thin amount
of fluid overlying the investing fascia of the lateral compartment
musculature (series 25, image 48). No significant deep fascial fluid
collection or nodular fascial enhancement.

Muscles and Tendons

There is nonspecific intramuscular edema within the anterior
compartment musculature, particularly within the tibialis anterior
and extensor digitorum longus muscles. The imaged tendons appear
grossly intact.
IMPRESSION: 1. Prior left total knee arthroplasty without MR evidence of
loosening or other osseous abnormality.
2. Small-moderate left knee joint effusion with synovial thickening.
Infected versus noninfected fluid is unable to be distinguished by
MRI. If clinical concern for septic arthritis, arthrocentesis would
be recommended.
3. Large complex multiloculated fluid collection is partially
visualized at the posterior proximal calf which appears to emanate
from a Baker's cyst.
4. Nonspecific circumferential subcutaneous edema most pronounced at
the visualized proximal calf, which may represent cellulitis in the
appropriate clinical setting.
5. Mild intramuscular edema within the visualized anterior
compartment of the lower leg, which could represent myositis versus
a muscular strain.

## 2020-01-12 ENCOUNTER — Other Ambulatory Visit: Payer: Self-pay | Admitting: Urology

## 2020-01-12 ENCOUNTER — Encounter: Payer: Self-pay | Admitting: Urology

## 2020-01-12 DIAGNOSIS — E291 Testicular hypofunction: Secondary | ICD-10-CM

## 2020-01-13 ENCOUNTER — Other Ambulatory Visit: Payer: Self-pay | Admitting: *Deleted

## 2020-01-13 NOTE — Telephone Encounter (Signed)
Pending testosterone

## 2020-01-14 MED ORDER — TESTOSTERONE CYPIONATE 200 MG/ML IM SOLN: INTRAMUSCULAR | 0 refills | Status: DC

## 2020-01-15 ENCOUNTER — Telehealth: Payer: Self-pay | Admitting: *Deleted

## 2020-01-15 NOTE — Telephone Encounter (Signed)
Patient coming in on 01/21/2020 for testosterone level

## 2020-01-15 NOTE — Telephone Encounter (Signed)
Will need a mid cycle testosterone level before any dose change

## 2020-01-16 ENCOUNTER — Other Ambulatory Visit: Payer: Self-pay | Admitting: Urology

## 2020-01-20 ENCOUNTER — Other Ambulatory Visit: Payer: Self-pay

## 2020-01-20 DIAGNOSIS — E291 Testicular hypofunction: Secondary | ICD-10-CM

## 2020-01-21 ENCOUNTER — Other Ambulatory Visit: Payer: Self-pay

## 2020-01-21 ENCOUNTER — Other Ambulatory Visit: Payer: BC Managed Care – PPO

## 2020-01-21 DIAGNOSIS — E291 Testicular hypofunction: Secondary | ICD-10-CM

## 2020-01-22 ENCOUNTER — Encounter: Payer: Self-pay | Admitting: Urology

## 2020-01-22 LAB — TESTOSTERONE: Testosterone: 621 ng/dL (ref 264–916)

## 2020-01-23 ENCOUNTER — Telehealth: Payer: Self-pay | Admitting: Urology

## 2020-01-23 DIAGNOSIS — E291 Testicular hypofunction: Secondary | ICD-10-CM

## 2020-01-23 MED ORDER — TESTOSTERONE CYPIONATE 200 MG/ML IM SOLN: INTRAMUSCULAR | 0 refills | Status: DC

## 2020-01-23 NOTE — Telephone Encounter (Signed)
He may increase his testosterone injections to 200 mg (1 mL) weekly.  Will need a trough testosterone level in 1 month

## 2020-01-23 NOTE — Telephone Encounter (Signed)
Notified patient as instructed, patient pleased. Discussed follow-up appointments, patient agrees  

## 2020-02-18 ENCOUNTER — Other Ambulatory Visit: Payer: Self-pay | Admitting: *Deleted

## 2020-02-18 DIAGNOSIS — E291 Testicular hypofunction: Secondary | ICD-10-CM

## 2020-02-20 ENCOUNTER — Other Ambulatory Visit: Payer: Self-pay

## 2020-02-20 ENCOUNTER — Other Ambulatory Visit: Payer: BC Managed Care – PPO

## 2020-02-20 DIAGNOSIS — E291 Testicular hypofunction: Secondary | ICD-10-CM

## 2020-02-21 LAB — TESTOSTERONE: Testosterone: 944 ng/dL — ABNORMAL HIGH (ref 264–916)

## 2020-02-23 ENCOUNTER — Telehealth: Payer: Self-pay | Admitting: Urology

## 2020-02-23 NOTE — Telephone Encounter (Signed)
Patient last injection was 02/11/2020. Lab was done on 02/20/2020. Patient states he does not want to change is dose. He states if needed to change it we can talk about it on next appt.

## 2020-02-23 NOTE — Telephone Encounter (Signed)
Testosterone level slightly elevated above baseline.  When was last injection in relation to this blood draw?

## 2020-03-11 ENCOUNTER — Other Ambulatory Visit: Payer: Self-pay

## 2020-04-05 ENCOUNTER — Encounter: Payer: Self-pay | Admitting: Urology

## 2020-04-05 ENCOUNTER — Other Ambulatory Visit: Payer: Self-pay

## 2020-04-05 ENCOUNTER — Ambulatory Visit: Payer: BC Managed Care – PPO | Admitting: Urology

## 2020-04-05 VITALS — BP 127/78 | HR 90 | Ht 75.0 in | Wt 320.0 lb

## 2020-04-05 DIAGNOSIS — E291 Testicular hypofunction: Secondary | ICD-10-CM

## 2020-04-06 ENCOUNTER — Encounter: Payer: Self-pay | Admitting: Urology

## 2020-04-06 ENCOUNTER — Telehealth: Payer: Self-pay | Admitting: *Deleted

## 2020-04-06 ENCOUNTER — Other Ambulatory Visit: Payer: Self-pay | Admitting: Urology

## 2020-04-06 DIAGNOSIS — E291 Testicular hypofunction: Secondary | ICD-10-CM

## 2020-04-06 LAB — HEMATOCRIT: Hematocrit: 48.2 % (ref 37.5–51.0)

## 2020-04-06 LAB — TESTOSTERONE: Testosterone: 962 ng/dL — ABNORMAL HIGH (ref 264–916)

## 2020-04-06 LAB — PSA: Prostate Specific Ag, Serum: 0.6 ng/mL (ref 0.0–4.0)

## 2020-04-06 MED ORDER — TESTOSTERONE CYPIONATE 200 MG/ML IM SOLN: INTRAMUSCULAR | 0 refills | Status: DC

## 2020-04-06 NOTE — Telephone Encounter (Signed)
Notified patient as instructed, patient pleased. Discussed follow-up appointments, patient agrees  

## 2020-04-06 NOTE — Telephone Encounter (Signed)
-----   Message from [name], MD sent at 04/06/2020  3:39 PM EDT ----- PSA was 0.6, testosterone level 962 and hematocrit 48.2

## 2020-04-06 NOTE — Progress Notes (Signed)
04/05/2020 2:56 PM   Rodney Howard 06/24/1966 [id number]  Referring provider: Hortencia Pilar, [place],  [place] [address]  Chief Complaint  Patient presents with  . Hypogonadism    Urologic history: 1.  Hypogonadism             -On TRT testosterone cypionate  2.  Erectile dysfunction             -PDE 5 inhibitor  HPI: 54 y.o. male presents for follow-up of hypogonadism.   On testosterone cypionate 200 mg weekly and anastrozole  Takes 90-100 mg oxycodone daily for chronic back pain which he states significantly impacts his libido  No bothersome LUTS  Denies dysuria, gross hematuria  He injects every Wednesday  Testosterone level 02/2020 was 944   PMH: Past Medical History:  Diagnosis Date  . ADD (attention deficit disorder)   . Anxiety   . Bipolar 1 disorder ([place])   . BPH (benign prostatic hyperplasia)   . DDD (degenerative disc disease), cervical   . Depression   . Erectile dysfunction   . GERD (gastroesophageal reflux disease)   . Gout   . HLD (hyperlipidemia)   . HTN (hypertension)   . Hypogonadism in male   . Lymphedema   . Nocturia   . Obesity   . Obesity   . Osteoarthritis   . Peripheral neuropathy    feet  . S/P insertion of spinal cord stimulator   . Sleep apnea    CPAP  . Tinea cruris     Surgical History: Past Surgical History:  Procedure Laterality Date  . BACK SURGERY    . CARDIAC CATHETERIZATION    . COLONOSCOPY WITH PROPOFOL N/A 06/25/2017   Procedure: COLONOSCOPY WITH PROPOFOL;  Surgeon: Lucilla Lame, MD;  Location: [facility];  Service: Endoscopy;  Laterality: N/A;  sleep apnea  . JOINT REPLACEMENT     x 3  . LUMBAR DISC SURGERY     x 3  . POLYPECTOMY N/A 06/25/2017   Procedure: POLYPECTOMY;  Surgeon: Lucilla Lame, MD;  Location: [facility];  Service: Endoscopy;  Laterality: N/A;  . STOMACH SURGERY     gastric sleeve  . TOE SURGERY    . TOTAL KNEE ARTHROPLASTY Left 2014  .  UMBILICAL HERNIA REPAIR N/A 02/17/2016   Procedure: HERNIA REPAIR UMBILICAL ADULT;  Surgeon: Florene Glen, MD;  Location: [facility];  Service: General;  Laterality: N/A;  . WRIST SURGERY      Home Medications:  Allergies as of 04/05/2020      Reactions   Atorvastatin Other (See Comments)   Leg pain and severe headaches   Demerol [meperidine]    Interacts with other medication, causes blood pressure to raise   Statins Other (See Comments)   Headaches/migraines   Other Swelling, Rash   dermabond      Medication List       Accurate as of April 05, 2020 11:59 PM. If you have any questions, ask your nurse or doctor.        Amantadine HCl 100 MG tablet Take 100 mg by mouth 2 (two) times daily.   anastrozole 1 MG tablet Commonly known as: ARIMIDEX Take 1 tablet by mouth once daily   ARIPiprazole 15 MG tablet Commonly known as: ABILIFY Take 15 mg by mouth at bedtime. Reported on 09/22/2015   aspirin 81 MG chewable tablet Chew 81 mg by mouth daily.   BARIATRIC MULTIVITAMINS/IRON PO Take by mouth.  bumetanide 1 MG tablet Commonly known as: BUMEX Take 2 mg by mouth daily as needed (diuretic).   CALCIUM PO Take 1,500 mg by mouth daily.   cephALEXin 500 MG capsule Commonly known as: KEFLEX Take 500 mg by mouth 2 (two) times daily.   Cholecalciferol 50 MCG (2000 UT) Tabs Take 2,000 Units by mouth daily. Reported on 02/03/2016   COLACE PO Take 100 mg by mouth 2 (two) times daily.   doxycycline 100 MG capsule Commonly known as: MONODOX Take by mouth.   erythromycin ophthalmic ointment Apply to eye.   ezetimibe 10 MG tablet Commonly known as: ZETIA Take 10 mg by mouth daily.   lamoTRIgine 200 MG tablet Commonly known as: LAMICTAL Take 200 mg by mouth 2 (two) times daily. Reported on 09/22/2015   lisinopril 20 MG tablet Commonly known as: ZESTRIL Take 20 mg by mouth daily.   metoprolol succinate 100 MG 24 hr tablet Commonly known as: TOPROL-XL Take 100 mg by  mouth at bedtime.   Narcan 4 MG/0.1ML Liqd nasal spray kit Generic drug: naloxone CALL 911. ADMINISTER A SINGLE SPRAY OF NARCAN IN ONE NOSTRIL. REPEAT EVERY 3 MINUTES AS NEEDED IF NO OR MINIMAL RESPONSE.   oxyCODONE HCl 15 MG Taba Take 15 mg by mouth.   pantoprazole 40 MG tablet Commonly known as: PROTONIX Take 40 mg by mouth daily.   polycarbophil 625 MG tablet Commonly known as: FIBERCON Take 625 mg by mouth 2 (two) times daily.   Relief Knee Misc Use as directed   sildenafil 100 MG tablet Commonly known as: Viagra Take 1 tablet (100 mg total) by mouth as needed for erectile dysfunction.   sildenafil 20 MG tablet Commonly known as: REVATIO Take 3-5 tablets daily as needed prior to intercourse   testosterone cypionate 200 MG/ML injection Commonly known as: DEPOTESTOSTERONE CYPIONATE 200 mg weekly   tranylcypromine 10 MG tablet Commonly known as: PARNATE Take 30 mg by mouth 3 (three) times daily. 9 tablets daily   VITAMIN B-12 SL Place under the tongue daily.       Allergies:  Allergies  Allergen Reactions  . Atorvastatin Other (See Comments)    Leg pain and severe headaches  . Demerol [Meperidine]     Interacts with other medication, causes blood pressure to raise  . Statins Other (See Comments)    Headaches/migraines  . Other Swelling and Rash    dermabond    Family History: Family History  Problem Relation Age of Onset  . Arthritis Mother   . Deep vein thrombosis Mother   . Uterine cancer Mother   . Heart disease Mother   . Cirrhosis Sister   . Heart disease Father   . Diabetes Mellitus II Father        Mother, Sister  . Parkinson's disease Father   . Stroke Father   . Kidney disease Neg Hx   . Prostate cancer Neg Hx     Social History:  reports that he has never smoked. He has never used smokeless tobacco. He reports current alcohol use of about 2.0 standard drinks of alcohol per week. He reports that he does not use drugs.   Physical  Exam: BP 127/78   Pulse 90   Ht 6' 3" (1.905 m)   Wt (!) 320 lb (145.2 kg)   BMI 40.00 kg/m   Constitutional:  Alert and oriented, No acute distress. HEENT: [place] AT, moist mucus membranes.  Trachea midline, no masses. Cardiovascular: No clubbing, cyanosis, or edema. Respiratory:  Normal respiratory effort, no increased work of breathing. GU: Prostate 30 g, smooth without nodules Skin: No rashes, bruises or suspicious lesions. Neurologic: Grossly intact, no focal deficits, moving all 4 extremities. Psychiatric: Normal mood and affect.   Assessment & Plan:    1. Hypogonadism in male  Testosterone, hematocrit, PSA ordered today  Follow-up lab visit 6 months for testosterone/hematocrit  Office visit 1 year for DRE and labs  Testosterone refilled   [name] Stoioff, MD  [facility] [address], [address] [place], [place] [address] [phone]  

## 2020-04-30 ENCOUNTER — Other Ambulatory Visit: Payer: Self-pay | Admitting: Urology

## 2020-05-17 DIAGNOSIS — G8929 Other chronic pain: Secondary | ICD-10-CM | POA: Insufficient documentation

## 2020-05-26 DIAGNOSIS — F319 Bipolar disorder, unspecified: Secondary | ICD-10-CM | POA: Insufficient documentation

## 2020-07-01 ENCOUNTER — Other Ambulatory Visit: Payer: Self-pay | Admitting: Urology

## 2020-07-01 ENCOUNTER — Encounter: Payer: Self-pay | Admitting: Urology

## 2020-07-01 DIAGNOSIS — E291 Testicular hypofunction: Secondary | ICD-10-CM

## 2020-07-09 ENCOUNTER — Other Ambulatory Visit: Payer: Self-pay

## 2020-07-09 ENCOUNTER — Encounter: Payer: Self-pay | Admitting: Urology

## 2020-07-09 DIAGNOSIS — N5201 Erectile dysfunction due to arterial insufficiency: Secondary | ICD-10-CM

## 2020-07-12 ENCOUNTER — Other Ambulatory Visit: Payer: Self-pay | Admitting: *Deleted

## 2020-07-12 MED ORDER — SILDENAFIL CITRATE 100 MG PO TABS: 100.0000 mg | ORAL_TABLET | ORAL | 0 refills | Status: DC | PRN

## 2020-08-14 ENCOUNTER — Other Ambulatory Visit: Payer: Self-pay | Admitting: Urology

## 2020-09-15 ENCOUNTER — Other Ambulatory Visit: Payer: Self-pay

## 2020-09-15 DIAGNOSIS — E291 Testicular hypofunction: Secondary | ICD-10-CM

## 2020-09-17 ENCOUNTER — Other Ambulatory Visit: Payer: Self-pay

## 2020-09-17 ENCOUNTER — Other Ambulatory Visit: Payer: BC Managed Care – PPO

## 2020-09-17 DIAGNOSIS — E291 Testicular hypofunction: Secondary | ICD-10-CM

## 2020-09-18 ENCOUNTER — Telehealth: Payer: Self-pay | Admitting: Urology

## 2020-09-18 LAB — HEMATOCRIT: Hematocrit: 42.7 % (ref 37.5–51.0)

## 2020-09-18 LAB — TESTOSTERONE: Testosterone: 1272 ng/dL — ABNORMAL HIGH (ref 264–916)

## 2020-09-18 NOTE — Telephone Encounter (Signed)
Testosterone level was elevated at 1272.  He indicated on prior notes he injects every Sunday.  Please verify and if Sunday is his injection today and the blood was drawn Friday we will need to decrease his dose.

## 2020-09-22 ENCOUNTER — Other Ambulatory Visit: Payer: Self-pay | Admitting: Urology

## 2020-09-22 DIAGNOSIS — E291 Testicular hypofunction: Secondary | ICD-10-CM

## 2020-09-27 ENCOUNTER — Encounter: Payer: Self-pay | Admitting: Urology

## 2020-09-27 ENCOUNTER — Telehealth: Payer: Self-pay | Admitting: *Deleted

## 2020-09-27 NOTE — Telephone Encounter (Signed)
-----   Message from [name], MD sent at 09/26/2020  5:22 PM EST ----- Regarding: Testosterone Received testosterone refill request. Please see my telephone encounter of 09/18/2020 regarding his most recent elevated testosterone level and last injection.

## 2020-09-27 NOTE — Telephone Encounter (Signed)
Talked with patient today, patient states he give his self injections every Sunday . He did injection medication the Sunday before labs.

## 2020-09-28 ENCOUNTER — Other Ambulatory Visit: Payer: Self-pay | Admitting: *Deleted

## 2020-09-28 DIAGNOSIS — E291 Testicular hypofunction: Secondary | ICD-10-CM

## 2020-09-28 NOTE — Telephone Encounter (Signed)
Patient states he needs a refill.

## 2020-09-28 NOTE — Telephone Encounter (Signed)
Notified patient as instructed, patient Appt maded

## 2020-09-28 NOTE — Telephone Encounter (Signed)
Repeat a midcycle testosterone level (Wednesday) and 3-4 weeks.  If testosterone level remains elevated above 2000 we will need to either reduce dose or injection interval

## 2020-09-28 NOTE — Telephone Encounter (Signed)
Send a message to Dr. Lonna Cobb about testosterone refill

## 2020-09-29 MED ORDER — TESTOSTERONE CYPIONATE 200 MG/ML IM SOLN: INTRAMUSCULAR | 0 refills | Status: DC

## 2020-10-06 ENCOUNTER — Other Ambulatory Visit: Payer: Self-pay

## 2020-10-20 ENCOUNTER — Other Ambulatory Visit: Payer: Self-pay

## 2020-10-20 DIAGNOSIS — E291 Testicular hypofunction: Secondary | ICD-10-CM

## 2020-10-25 ENCOUNTER — Other Ambulatory Visit: Payer: BC Managed Care – PPO

## 2020-10-25 ENCOUNTER — Other Ambulatory Visit: Payer: Self-pay

## 2020-10-25 DIAGNOSIS — E291 Testicular hypofunction: Secondary | ICD-10-CM

## 2020-10-26 ENCOUNTER — Other Ambulatory Visit: Payer: Self-pay | Admitting: Urology

## 2020-10-26 ENCOUNTER — Encounter: Payer: Self-pay | Admitting: Urology

## 2020-10-26 DIAGNOSIS — E291 Testicular hypofunction: Secondary | ICD-10-CM

## 2020-10-26 LAB — TESTOSTERONE: Testosterone: 1230 ng/dL — ABNORMAL HIGH (ref 264–916)

## 2020-10-26 NOTE — Telephone Encounter (Signed)
Repeat testosterone level remains elevated at >1200.  Recommend decreasing testosterone dose to 140 mg weekly (0.7 cc)

## 2020-10-27 NOTE — Telephone Encounter (Signed)
Patient notified, needs new script sent to the pharmacy. He is currently out of medication. Does he need to have lab work done in the future? Please advise

## 2020-10-29 MED ORDER — TESTOSTERONE CYPIONATE 200 MG/ML IM SOLN: 140.0000 mg | INTRAMUSCULAR | 0 refills | Status: DC

## 2020-10-29 NOTE — Telephone Encounter (Signed)
Dose change, recheck T level 6-8 weeks

## 2020-11-18 ENCOUNTER — Other Ambulatory Visit: Payer: Self-pay | Admitting: Urology

## 2020-11-30 ENCOUNTER — Other Ambulatory Visit: Payer: Self-pay | Admitting: *Deleted

## 2020-11-30 DIAGNOSIS — E291 Testicular hypofunction: Secondary | ICD-10-CM

## 2020-12-22 ENCOUNTER — Other Ambulatory Visit: Payer: Self-pay | Admitting: Physical Medicine and Rehabilitation

## 2020-12-22 DIAGNOSIS — M961 Postlaminectomy syndrome, not elsewhere classified: Secondary | ICD-10-CM

## 2020-12-23 ENCOUNTER — Other Ambulatory Visit: Payer: BC Managed Care – PPO

## 2020-12-23 ENCOUNTER — Other Ambulatory Visit: Payer: Self-pay

## 2020-12-23 DIAGNOSIS — E291 Testicular hypofunction: Secondary | ICD-10-CM

## 2020-12-24 ENCOUNTER — Encounter: Payer: Self-pay | Admitting: *Deleted

## 2020-12-24 LAB — TESTOSTERONE: Testosterone: 1246 ng/dL — ABNORMAL HIGH (ref 264–916)

## 2021-01-17 ENCOUNTER — Other Ambulatory Visit: Payer: Self-pay | Admitting: *Deleted

## 2021-01-17 DIAGNOSIS — E291 Testicular hypofunction: Secondary | ICD-10-CM

## 2021-01-17 NOTE — Progress Notes (Signed)
hct

## 2021-01-23 ENCOUNTER — Other Ambulatory Visit: Payer: Self-pay | Admitting: Urology

## 2021-01-23 DIAGNOSIS — N5201 Erectile dysfunction due to arterial insufficiency: Secondary | ICD-10-CM

## 2021-02-03 ENCOUNTER — Other Ambulatory Visit: Payer: Self-pay

## 2021-02-03 ENCOUNTER — Other Ambulatory Visit: Payer: BC Managed Care – PPO

## 2021-02-03 DIAGNOSIS — E291 Testicular hypofunction: Secondary | ICD-10-CM

## 2021-02-04 LAB — TESTOSTERONE: Testosterone: 783 ng/dL (ref 264–916)

## 2021-02-04 LAB — HEMOGLOBIN AND HEMATOCRIT, BLOOD
Hematocrit: 43.4 % (ref 37.5–51.0)
Hemoglobin: 14.8 g/dL (ref 13.0–17.7)

## 2021-02-07 ENCOUNTER — Encounter: Payer: Self-pay | Admitting: *Deleted

## 2021-02-25 ENCOUNTER — Other Ambulatory Visit: Payer: Self-pay | Admitting: Urology

## 2021-03-30 ENCOUNTER — Other Ambulatory Visit: Payer: Self-pay

## 2021-03-30 ENCOUNTER — Encounter: Payer: Self-pay | Admitting: Urology

## 2021-03-30 ENCOUNTER — Ambulatory Visit: Payer: BC Managed Care – PPO | Admitting: Urology

## 2021-03-30 ENCOUNTER — Other Ambulatory Visit: Payer: Self-pay | Admitting: Urology

## 2021-03-30 VITALS — BP 122/76 | HR 86 | Ht 75.0 in | Wt 330.0 lb

## 2021-03-30 DIAGNOSIS — E291 Testicular hypofunction: Secondary | ICD-10-CM

## 2021-03-30 DIAGNOSIS — N5201 Erectile dysfunction due to arterial insufficiency: Secondary | ICD-10-CM

## 2021-03-30 MED ORDER — TESTOSTERONE CYPIONATE 200 MG/ML IM SOLN: 100.0000 mg | INTRAMUSCULAR | 0 refills | Status: DC

## 2021-03-30 NOTE — Progress Notes (Signed)
refill 

## 2021-03-30 NOTE — Progress Notes (Signed)
03/30/2021 10:44 AM   Rodney Howard 1966-01-06 [id number]  Referring provider: Hortencia Pilar, MD [address] [place] [address] [place],  [place] [address]  Chief Complaint  Patient presents with   Hypogonadism    Urologic history: 1.  Hypogonadism             -On TRT testosterone cypionate   2.  Erectile dysfunction             -PDE 5 inhibitor  HPI: 55 y.o. male presents for annual follow-up of hypogonadism.  Labs 02/03/2021: Testosterone 783 ng/dL, hematocrit 43.4 PSA 03/28/2021 stable 0.49 Currently injecting 100 mg weekly Stable symptoms Recently started on a fentanyl patch  PMH: Past Medical History:  Diagnosis Date   ADD (attention deficit disorder)    Anxiety    Bipolar 1 disorder ([facility])    BPH (benign prostatic hyperplasia)    DDD (degenerative disc disease), cervical    Depression    Erectile dysfunction    GERD (gastroesophageal reflux disease)    Gout    HLD (hyperlipidemia)    HTN (hypertension)    Hypogonadism in male    Lymphedema    Nocturia    Obesity    Obesity    Osteoarthritis    Peripheral neuropathy    feet   S/P insertion of spinal cord stimulator    Sleep apnea    CPAP   Tinea cruris     Surgical History: Past Surgical History:  Procedure Laterality Date   BACK SURGERY     CARDIAC CATHETERIZATION     COLONOSCOPY WITH PROPOFOL N/A 06/25/2017   Procedure: COLONOSCOPY WITH PROPOFOL;  Surgeon: Lucilla Lame, MD;  Location: [facility];  Service: Endoscopy;  Laterality: N/A;  sleep apnea   JOINT REPLACEMENT     x 3   LUMBAR DISC SURGERY     x 3   POLYPECTOMY N/A 06/25/2017   Procedure: POLYPECTOMY;  Surgeon: Lucilla Lame, MD;  Location: [facility];  Service: Endoscopy;  Laterality: N/A;   STOMACH SURGERY     gastric sleeve   TOE SURGERY     TOTAL KNEE ARTHROPLASTY Left 8811   UMBILICAL HERNIA REPAIR N/A 02/17/2016   Procedure: HERNIA REPAIR UMBILICAL ADULT;  Surgeon: Florene Glen, MD;  Location: [facility];   Service: General;  Laterality: N/A;   WRIST SURGERY      Home Medications:  Allergies as of 03/30/2021       Reactions   Atorvastatin Other (See Comments)   Leg pain and severe headaches   Demerol [meperidine]    Interacts with other medication, causes blood pressure to raise   Statins Other (See Comments)   Headaches/migraines   Other Swelling, Rash   dermabond        Medication List        Accurate as of March 30, 2021 10:44 AM. If you have any questions, ask your nurse or doctor.          STOP taking these medications    Amantadine HCl 100 MG tablet Stopped by: Abbie Sons, MD   CALCIUM PO Stopped by: Abbie Sons, MD       TAKE these medications    anastrozole 1 MG tablet Commonly known as: ARIMIDEX Take 1 tablet by mouth once daily   ARIPiprazole 15 MG tablet Commonly known as: ABILIFY Take 15 mg by mouth at bedtime. Reported on 09/22/2015   aspirin 81 MG chewable tablet Chew 81 mg by mouth  daily.   BARIATRIC MULTIVITAMINS/IRON PO Take by mouth.   bumetanide 1 MG tablet Commonly known as: BUMEX Take 2 mg by mouth daily as needed (diuretic).   Cholecalciferol 50 MCG (2000 UT) Tabs Take 2,000 Units by mouth daily. Reported on 02/03/2016   COLACE PO Take 100 mg by mouth 2 (two) times daily.   ezetimibe 10 MG tablet Commonly known as: ZETIA Take 10 mg by mouth daily.   lamoTRIgine 200 MG tablet Commonly known as: LAMICTAL Take 200 mg by mouth 2 (two) times daily. Reported on 09/22/2015   metoprolol succinate 100 MG 24 hr tablet Commonly known as: TOPROL-XL Take 100 mg by mouth at bedtime.   Narcan 4 MG/0.1ML Liqd nasal spray kit Generic drug: naloxone CALL 911. ADMINISTER A SINGLE SPRAY OF NARCAN IN ONE NOSTRIL. REPEAT EVERY 3 MINUTES AS NEEDED IF NO OR MINIMAL RESPONSE.   oxyCODONE HCl 15 MG Taba Take 15 mg by mouth.   pantoprazole 40 MG tablet Commonly known as: PROTONIX Take 40 mg by mouth daily.   polycarbophil 625 MG  tablet Commonly known as: FIBERCON Take 625 mg by mouth 2 (two) times daily.   Relief Knee Misc Use as directed   sildenafil 100 MG tablet Commonly known as: VIAGRA TAKE 1 TABLET BY MOUTH  AS NEEDED FOR  ERECTILE  DYSFUNCTION   testosterone cypionate 200 MG/ML injection Commonly known as: DEPOTESTOSTERONE CYPIONATE Inject 0.5 mLs (100 mg total) into the muscle once a week.   tranylcypromine 10 MG tablet Commonly known as: PARNATE Take 30 mg by mouth 3 (three) times daily. 9 tablets daily   VITAMIN B-12 SL Place under the tongue daily.        Allergies:  Allergies  Allergen Reactions   Atorvastatin Other (See Comments)    Leg pain and severe headaches   Demerol [Meperidine]     Interacts with other medication, causes blood pressure to raise   Statins Other (See Comments)    Headaches/migraines   Other Swelling and Rash    dermabond    Family History: Family History  Problem Relation Age of Onset   Arthritis Mother    Deep vein thrombosis Mother    Uterine cancer Mother    Heart disease Mother    Cirrhosis Sister    Heart disease Father    Diabetes Mellitus II Father        Mother, Sister   Parkinson's disease Father    Stroke Father    Kidney disease Neg Hx    Prostate cancer Neg Hx     Social History:  reports that he has never smoked. He has never used smokeless tobacco. He reports current alcohol use of about 2.0 standard drinks per week. He reports that he does not use drugs.   Physical Exam: BP 122/76   Pulse 86   Ht 6' 3"  (1.905 m)   Wt (!) 330 lb (149.7 kg)   BMI 41.25 kg/m   Constitutional:  Alert and oriented, No acute distress. HEENT: [place] AT, moist mucus membranes.  Trachea midline, no masses. Cardiovascular: No clubbing, cyanosis, or edema. Respiratory: Normal respiratory effort, no increased work of breathing. GU: Prostate 30 g, smooth without nodules Skin: No rashes, bruises or suspicious lesions. Neurologic: Grossly intact, no focal  deficits, moving all 4 extremities. Psychiatric: Normal mood and affect.   Assessment & Plan:    1.  Hypogonadism Stable on TRT Testosterone refill sent to pharmacy Lab visit 6 months for testosterone/hematocrit and 1 year testosterone/hematocrit/PSA  and DRE  2.  Erectile dysfunction Stable   Abbie Sons, MD  [facility] [address], [place], [place] [address] [phone]

## 2021-04-06 ENCOUNTER — Ambulatory Visit: Payer: Self-pay | Admitting: Urology

## 2021-06-08 ENCOUNTER — Other Ambulatory Visit: Payer: Self-pay | Admitting: Urology

## 2021-06-08 DIAGNOSIS — N5201 Erectile dysfunction due to arterial insufficiency: Secondary | ICD-10-CM

## 2021-06-14 ENCOUNTER — Other Ambulatory Visit: Payer: Self-pay | Admitting: *Deleted

## 2021-06-14 DIAGNOSIS — N5201 Erectile dysfunction due to arterial insufficiency: Secondary | ICD-10-CM

## 2021-06-14 MED ORDER — SILDENAFIL CITRATE 100 MG PO TABS: ORAL_TABLET | ORAL | 0 refills | Status: DC

## 2021-06-14 MED ORDER — ANASTROZOLE 1 MG PO TABS: 1.0000 mg | ORAL_TABLET | Freq: Every day | ORAL | 0 refills | Status: DC

## 2021-06-23 ENCOUNTER — Other Ambulatory Visit: Payer: Self-pay | Admitting: Physical Medicine and Rehabilitation

## 2021-06-23 DIAGNOSIS — M542 Cervicalgia: Secondary | ICD-10-CM

## 2021-06-30 ENCOUNTER — Ambulatory Visit
Admission: RE | Admit: 2021-06-30 | Discharge: 2021-06-30 | Disposition: A | Discharge status: Home / Self Care | Payer: BC Managed Care – PPO | Source: Ambulatory Visit | Attending: Physical Medicine and Rehabilitation | Admitting: Physical Medicine and Rehabilitation

## 2021-06-30 ENCOUNTER — Other Ambulatory Visit: Payer: Self-pay

## 2021-06-30 DIAGNOSIS — M542 Cervicalgia: Secondary | ICD-10-CM | POA: Insufficient documentation

## 2021-07-10 ENCOUNTER — Other Ambulatory Visit: Payer: Self-pay | Admitting: Urology

## 2021-07-10 ENCOUNTER — Encounter: Payer: Self-pay | Admitting: Urology

## 2021-07-14 MED ORDER — TESTOSTERONE CYPIONATE 200 MG/ML IM SOLN: 100.0000 mg | INTRAMUSCULAR | 0 refills | Status: DC

## 2021-09-04 ENCOUNTER — Encounter: Payer: Self-pay | Admitting: Urology

## 2021-09-04 ENCOUNTER — Other Ambulatory Visit: Payer: Self-pay | Admitting: Urology

## 2021-09-22 ENCOUNTER — Other Ambulatory Visit: Payer: Self-pay

## 2021-09-22 ENCOUNTER — Other Ambulatory Visit: Payer: BC Managed Care – PPO

## 2021-09-22 DIAGNOSIS — E291 Testicular hypofunction: Secondary | ICD-10-CM

## 2021-09-23 ENCOUNTER — Encounter: Payer: Self-pay | Admitting: *Deleted

## 2021-09-23 LAB — HEMATOCRIT: Hematocrit: 46 % (ref 37.5–51.0)

## 2021-09-23 LAB — TESTOSTERONE: Testosterone: 797 ng/dL (ref 264–916)

## 2021-09-24 ENCOUNTER — Other Ambulatory Visit: Payer: Self-pay | Admitting: Urology

## 2021-09-30 MED ORDER — TESTOSTERONE CYPIONATE 200 MG/ML IM SOLN: 100.0000 mg | INTRAMUSCULAR | 0 refills | Status: DC

## 2021-10-03 ENCOUNTER — Other Ambulatory Visit: Payer: BC Managed Care – PPO

## 2021-12-11 ENCOUNTER — Other Ambulatory Visit: Payer: Self-pay | Admitting: Urology

## 2021-12-12 MED ORDER — ANASTROZOLE 1 MG PO TABS: 1.0000 mg | ORAL_TABLET | Freq: Every day | ORAL | 0 refills | Status: DC

## 2021-12-26 ENCOUNTER — Other Ambulatory Visit: Payer: Self-pay | Admitting: Urology

## 2021-12-28 MED ORDER — TESTOSTERONE CYPIONATE 200 MG/ML IM SOLN: 100.0000 mg | INTRAMUSCULAR | 0 refills | Status: DC

## 2022-01-19 ENCOUNTER — Other Ambulatory Visit: Payer: Self-pay | Admitting: Physical Medicine and Rehabilitation

## 2022-01-20 ENCOUNTER — Other Ambulatory Visit: Payer: Self-pay | Admitting: Physical Medicine and Rehabilitation

## 2022-01-20 DIAGNOSIS — M542 Cervicalgia: Secondary | ICD-10-CM

## 2022-01-30 ENCOUNTER — Ambulatory Visit (INDEPENDENT_AMBULATORY_CARE_PROVIDER_SITE_OTHER): Payer: BC Managed Care – PPO | Admitting: Internal Medicine

## 2022-01-30 VITALS — BP 107/69 | HR 81 | Resp 18 | Ht 75.0 in | Wt 341.0 lb

## 2022-01-30 DIAGNOSIS — G4731 Primary central sleep apnea: Secondary | ICD-10-CM

## 2022-01-30 DIAGNOSIS — Z8739 Personal history of other diseases of the musculoskeletal system and connective tissue: Secondary | ICD-10-CM | POA: Insufficient documentation

## 2022-01-30 DIAGNOSIS — Z7189 Other specified counseling: Secondary | ICD-10-CM

## 2022-01-30 NOTE — Progress Notes (Signed)
Sleep Medicine   Office Visit  Patient Name: Rodney Howard DOB: [birth year] MRN [id number]    Chief Complaint: establish care for sleep apnea  Brief History:  Zakariye presents with a 10+ years history of sleep apnea. The patient relates the following symptoms: very loud snoring, choking/gasping,chest pain and currently on CPAP he still snores a lot & chokes are also present. The patient goes to sleep at 11pm and wakes up at 6 am.  Patient has noted some restlessness of his legs at night.  The patient  relates gasping and snoring behavior during the night.  The patient reports a history of psychiatric problems (bipolar disorder). The Epworth Sleepiness Score is 13 out of 24 .  The patient relates  Cardiovascular risk factors include: coronary artery disease.     ROS  General: (-) fever, (-) chills, (-) night sweat Nose and Sinuses: (-) nasal stuffiness or itchiness, (-) postnasal drip, (-) nosebleeds, (-) sinus trouble. Mouth and Throat: (-) sore throat, (-) hoarseness. Neck: (-) swollen glands, (-) enlarged thyroid, (-) neck pain. Respiratory: - cough, - shortness of breath, - wheezing. Neurologic: +numbness, + tingling. Psychiatric: + anxiety, + depression Sleep behavior: -sleep paralysis -hypnogogic hallucinations -dream enactment      -vivid dreams -cataplexy -night terrors -sleep walking   Current Medication: Outpatient Encounter Medications as of 01/30/2022  Medication Sig   ARIPiprazole (ABILIFY) 15 MG tablet Take by mouth.   ezetimibe (ZETIA) 10 MG tablet Take 1 tablet by mouth daily.   fentaNYL (DURAGESIC) 25 MCG/HR Place onto the skin.   metoprolol succinate (TOPROL-XL) 100 MG 24 hr tablet Take 1 tablet by mouth daily.   pantoprazole (PROTONIX) 40 MG tablet Take 1 tablet by mouth daily.   testosterone cypionate (DEPOTESTOSTERONE CYPIONATE) 200 MG/ML injection Inject into the muscle.   anastrozole (ARIMIDEX) 1 MG tablet Take 1 tablet (1 mg total) by mouth daily.    ARIPiprazole (ABILIFY) 15 MG tablet Take 15 mg by mouth at bedtime. Reported on 09/22/2015   aspirin 81 MG chewable tablet Chew 81 mg by mouth daily.    Cholecalciferol 2000 UNITS TABS Take 2,000 Units by mouth daily. Reported on 02/03/2016   Cyanocobalamin (VITAMIN B-12 SL) Place under the tongue daily.   Elastic Bandages & Supports (RELIEF KNEE) MISC Use as directed   ezetimibe (ZETIA) 10 MG tablet Take 10 mg by mouth daily.   lamoTRIgine (LAMICTAL) 200 MG tablet Take 200 mg by mouth 2 (two) times daily. Reported on 09/22/2015   lisinopril (ZESTRIL) 10 MG tablet Take 10 mg by mouth daily.   metoprolol succinate (TOPROL-XL) 100 MG 24 hr tablet Take 100 mg by mouth at bedtime.    Multiple Vitamins-Minerals (BARIATRIC MULTIVITAMINS/IRON PO) Take by mouth.   NARCAN 4 MG/0.1ML LIQD nasal spray kit CALL 911. ADMINISTER A SINGLE SPRAY OF NARCAN IN ONE NOSTRIL. REPEAT EVERY 3 MINUTES AS NEEDED IF NO OR MINIMAL RESPONSE.   oxyCODONE (ROXICODONE) 15 MG immediate release tablet Take 15 mg by mouth every 4 (four) hours.   oxyCODONE HCl 15 MG TABA Take 15 mg by mouth.   pantoprazole (PROTONIX) 40 MG tablet Take 40 mg by mouth daily.   sildenafil (VIAGRA) 100 MG tablet TAKE 1 TABLET BY MOUTH  AS NEEDED FOR  ERECTILE  DYSFUNCTION   testosterone cypionate (DEPOTESTOSTERONE CYPIONATE) 200 MG/ML injection Inject 0.5 mLs (100 mg total) into the muscle once a week.   tranylcypromine (PARNATE) 10 MG tablet Take 30 mg by mouth 3 (three) times daily. 9 tablets  daily   [DISCONTINUED] bumetanide (BUMEX) 1 MG tablet Take 2 mg by mouth daily as needed (diuretic).    No facility-administered encounter medications on file as of 01/30/2022.    Surgical History: Past Surgical History:  Procedure Laterality Date   BACK SURGERY     CARDIAC CATHETERIZATION     COLONOSCOPY WITH PROPOFOL N/A 06/25/2017   Procedure: COLONOSCOPY WITH PROPOFOL;  Surgeon: Lucilla Lame, MD;  Location: [facility];  Service: Endoscopy;   Laterality: N/A;  sleep apnea   JOINT REPLACEMENT     x 3   LUMBAR DISC SURGERY     x 3   POLYPECTOMY N/A 06/25/2017   Procedure: POLYPECTOMY;  Surgeon: Lucilla Lame, MD;  Location: [facility];  Service: Endoscopy;  Laterality: N/A;   STOMACH SURGERY     gastric sleeve   TOE SURGERY     TOTAL KNEE ARTHROPLASTY Left 2947   UMBILICAL HERNIA REPAIR N/A 02/17/2016   Procedure: HERNIA REPAIR UMBILICAL ADULT;  Surgeon: Florene Glen, MD;  Location: [facility];  Service: General;  Laterality: N/A;   WRIST SURGERY      Medical History: Past Medical History:  Diagnosis Date   ADD (attention deficit disorder)    Anxiety    Bipolar 1 disorder (HCC)    BPH (benign prostatic hyperplasia)    DDD (degenerative disc disease), cervical    Depression    Erectile dysfunction    GERD (gastroesophageal reflux disease)    Gout    HLD (hyperlipidemia)    HTN (hypertension)    Hypogonadism in male    Lymphedema    Nocturia    Obesity    Obesity    Osteoarthritis    Peripheral neuropathy    feet   S/P insertion of spinal cord stimulator    Sleep apnea    CPAP   Tinea cruris     Family History: Non contributory to the present illness  Social History: Social History   Socioeconomic History   Marital status: Married    Spouse name: Not on file   Number of children: Not on file   Years of education: Not on file   Highest education level: Not on file  Occupational History   Not on file  Tobacco Use   Smoking status: Never   Smokeless tobacco: Never  Vaping Use   Vaping Use: Never used  Substance and Sexual Activity   Alcohol use: Yes    Alcohol/week: 2.0 standard drinks of alcohol    Types: 2 Cans of beer per week    Comment: occasional   Drug use: No   Sexual activity: Yes  Other Topics Concern   Not on file  Social History Narrative   Not on file   Social Determinants of Health   Financial Resource Strain: Not on file  Food Insecurity: Not on file   Transportation Needs: Not on file  Physical Activity: Not on file  Stress: Not on file  Social Connections: Not on file  Intimate Partner Violence: Not on file    Vital Signs: Blood pressure 107/69, pulse 81, resp. rate 18, height _0  (1.905 m), weight (!) 341 lb (154.7 kg), SpO2 98 %. Body mass index is 42.62 kg/m.   Examination: General Appearance: The patient is well-developed, well-nourished, and in no distress. Neck Circumference: 49 cm Skin: Gross inspection of skin unremarkable. Head: normocephalic, no gross deformities. Eyes: no gross deformities noted. ENT: ears appear grossly normal Neurologic: Alert and oriented. No involuntary  movements.    EPWORTH SLEEPINESS SCALE:  Scale:  (0)= no chance of dozing; (1)= slight chance of dozing; (2)= moderate chance of dozing; (3)= high chance of dozing  Chance  Situtation    Sitting and reading: 2    Watching TV: 2    Sitting Inactive in public: 1    As a passenger in car: 1      Lying down to rest: 3    Sitting and talking: 2    Sitting quielty after lunch: 2    In a car, stopped in traffic: 0   TOTAL SCORE:   13 out of 24    SLEEP STUDIES:  No studies  CPAP COMPLIANCE DATA:  Date Range: 11/01/21  -  01/29/22  Average Daily Use: 6:26 hours  Median Use: 6:54 hours  Compliance for > 4 Hours: 83%  AHI: 28.6 respiratory events per hour  Days Used: 88/90  Mask Leak: 27 lpm  95th Percentile Pressure: 9.4 cmH2O   LABS: No results found for this or any previous visit (from the past 2160 hour(s)).  Radiology: CT CERVICAL SPINE WO CONTRAST  Result Date: 07/01/2021 CLINICAL DATA:  Neck pain with upper extremity radiation and left-greater-than-right arm pain. EXAM: CT CERVICAL SPINE WITHOUT CONTRAST TECHNIQUE: Multidetector CT imaging of the cervical spine was performed without intravenous contrast. Multiplanar CT image reconstructions were also generated. COMPARISON:  None. FINDINGS: Alignment:  Straightened but otherwise normal. Skull base and vertebrae: No acute fracture. No primary bone lesion or focal pathologic process. The vertebra are normally mineralized and are normal in heights. There is cervical spondylosis beginning at C4. The images are grainy of low quality below the C4 level due to body habitus, technique and superimposition of the patient's shoulders. Soft tissues and spinal canal: No prevertebral fluid or swelling. No visible canal hematoma. Disc levels: There are normal disc heights at C2-3 and C3-4, mild disc space loss with small anterior and posterior endplate spurs at Q0-0, moderate disc space loss and bidirectional osteophytes C5-6, and collapsed discs C6-7, C7-T1 and T1-T2 where there may in part be vertebral body ankylosis, but this is difficult to evaluate given the poor image resolution below C4. The endplate spurring is minimal below C5. There are disc osteophyte complexes with mild thecal sac encroachment at C4-5 and C5-6, with other levels showing no definitive significant encroachment on the thecal sac. There are mild uncinate joint and facet spurring changes at most levels. Secondary acquired foraminal stenosis is mild on the left at C3-4, bilaterally moderate C4-5. The other foramina appear clear. Upper chest: Negative. Other: None. IMPRESSION: 1. No evidence of fractures or malalignment.  Straightened lordosis. 2. Cervical spondylosis, with degenerative disc disease progressively from C4-5 down, with collapsed discs beginning at C6-7 continuing through the upper most thoracic levels imaged. Some of these vertebral bodies could be ankylosed but this is difficult to confirm because the image resolution is quite poor below C4. 3. Foraminal stenosis as described above. Electronically Signed   By: Telford Nab M.D.   On: 07/01/2021 02:16    No results found.  No results found.    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[place]) 12/02/2018   Superficial thrombophlebitis 10/16/2018   Chronic constipation 08/15/2018   Hemorrhoids that prolapse with straining, but retract spontaneously 08/15/2018   Long term current use of opiate analgesic 08/15/2018   Chronic venous insufficiency 02/26/2018   ADD (attention deficit disorder) 01/15/2018   Seborrheic dermatitis 12/05/2017   Obesity 10/26/2017   Lymphedema 10/26/2017   Personal history of colonic polyps    Rectal polyp    Postlaminectomy syndrome, lumbar 07/04/2016   Spondylolisthesis of lumbar region 07/04/2016   Peripheral nerve disease 59/56/3875   Umbilical hernia without obstruction and without gangrene    Pure hypercholesterolemia 02/01/2016   Arthritis, degenerative 02/01/2016   Adiposity 02/01/2016   Gout 02/01/2016   Acid reflux 02/01/2016   Essential (primary) hypertension 02/01/2016   Narrowing of intervertebral disc space 02/01/2016   Bipolar I disorder, most recent episode depressed ([place]) 02/01/2016   Other specified behavioral and emotional disorders with onset usually occurring in childhood and adolescence 02/01/2016   Encounter for preprocedural cardiovascular examination 11/09/2015   H/O total knee replacement 10/25/2015   Morbid obesity ([place]) 10/25/2015   Loosening of knee joint prosthesis ([place]) 10/25/2015   Hypogonadism in male 09/22/2015   Erectile dysfunction of organic origin 09/22/2015   BPH with obstruction/lower urinary tract symptoms 09/22/2015   History of surgical procedure 04/22/2015   Acquired lymphedema 02/23/2014   Obstructive apnea 10/13/2013   Fatty infiltration of liver 10/07/2012   Arteriosclerosis  of coronary artery 10/07/2012   CAD (coronary artery disease) 10/07/2012   1. Complex sleep apnea syndrome Patient evaluation suggests high risk of sleep disordered breathing due to history of sleep apnea (PSG not available) and pt will need a new PSG to establish diagnosis and do a titration. His download from his cpap which he has been using consistently (83% compliance) shows an AHI of 28.6, mostly centrals.  He is most definitely not controlled. Patient has comorbid cardiovascular risk factors including: coronary artery disease. I have asked him to ask his cardiologist to update his echo as the last one was done in 2018.   Suggest: PSG and BIPAP titration to assess/treat the patient's sleep disordered breathing. The patient was also counselled on weight loss to optimize sleep health.   2. CPAP use counseling  Counseling: had a lengthy discussion with the patient regarding the importance of PAP therapy in management of the sleep apnea. Patient appears to understand the risk factor reduction and also understands the risks associated with untreated sleep apnea. Patient will try to make a good faith effort to remain compliant with therapy. Also instructed the patient on proper cleaning of the device including the water must be changed daily if possible and use of distilled water is preferred. Patient understands that the machine should be regularly cleaned with appropriate recommended cleaning solutions that do not damage the PAP machine for example given white vinegar and water rinses. Other methods such as ozone treatment may not be as good as these simple methods to achieve cleaning.   3. Morbid obesity ([place]) Obesity Counseling: Had a lengthy discussion regarding patients BMI and weight issues. Patient was instructed on portion control as well as increased activity. Also discussed caloric restrictions with trying to maintain intake less than 2000 Kcal. Discussions were made in accordance with the 5As  of weight management. Simple actions such as not eating late and if able to, taking a walk is suggested.      Patient evaluation suggests high risk of  sleep disordered breathing due to history of sleep apnea (PSG not available) and pt will need a new PSG to establish diagnosis and do a titration. His download from his cpap which he has been using consistently (83% compliance) shows an AHI of 28.6, mostly centrals.  Patient has comorbid cardiovascular risk factors including: coronary artery disease. I have asked him to ask his cardiologist to update his echo as the last one was done in 2018.   Suggest: PSG and BIPAP titration to assess/treat the patient's sleep disordered breathing. The patient was also counselled on weight loss to optimize sleep health.      General Counseling: I have discussed the findings of the evaluation and examination with Coralyn Mark.  I have also discussed any further diagnostic evaluation thatmay be needed or ordered today. Muhammed verbalizes understanding of the findings of todays visit. We also reviewed his medications today and discussed drug interactions and side effects including but not limited excessive drowsiness and altered mental states. We also discussed that there is always a risk not just to him but also people around him. he has been encouraged to call the office with any questions or concerns that should arise related to todays visit.  No orders of the defined types were placed in this encounter.       I have personally obtained a history, evaluated the patient, evaluated pertinent data, formulated the assessment and plan and placed orders.   This patient was seen today by Tressie Ellis, PA-C in collaboration with Dr. Devona Konig.   Allyne Gee, MD [facility] Diplomate ABMS Pulmonary and Critical Care Medicine Sleep medicine

## 2022-03-06 ENCOUNTER — Other Ambulatory Visit: Payer: Self-pay | Admitting: Urology

## 2022-03-07 MED ORDER — TESTOSTERONE CYPIONATE 200 MG/ML IM SOLN: 100.0000 mg | INTRAMUSCULAR | 0 refills | Status: DC

## 2022-03-30 ENCOUNTER — Other Ambulatory Visit: Payer: BC Managed Care – PPO

## 2022-03-30 DIAGNOSIS — E291 Testicular hypofunction: Secondary | ICD-10-CM

## 2022-03-31 ENCOUNTER — Ambulatory Visit: Payer: BC Managed Care – PPO | Admitting: Urology

## 2022-03-31 LAB — TESTOSTERONE: Testosterone: 556 ng/dL (ref 264–916)

## 2022-03-31 LAB — HEMATOCRIT: Hematocrit: 45 % (ref 37.5–51.0)

## 2022-04-03 ENCOUNTER — Ambulatory Visit: Payer: BC Managed Care – PPO | Admitting: Urology

## 2022-04-03 ENCOUNTER — Encounter: Payer: Self-pay | Admitting: Urology

## 2022-04-03 VITALS — BP 126/74 | HR 83 | Ht 75.0 in | Wt 340.0 lb

## 2022-04-03 DIAGNOSIS — Z125 Encounter for screening for malignant neoplasm of prostate: Secondary | ICD-10-CM | POA: Diagnosis not present

## 2022-04-03 DIAGNOSIS — E291 Testicular hypofunction: Secondary | ICD-10-CM

## 2022-04-03 NOTE — Progress Notes (Signed)
04/03/2022 1:49 PM   Rodney Howard Nov 27, 1965 [id number]  Referring provider: Hortencia Pilar, MD [address] [place] [address] [place],  [place] [address]  Chief Complaint  Patient presents with   Hypogonadism    Urologic history: 1.  Hypogonadism             -On TRT testosterone cypionate, anastrozole   2.  Erectile dysfunction             -PDE 5 inhibitor  HPI: 56 y.o. male presents for annual follow-up of hypogonadism.  Labs 03/30/2022: Testosterone 556 ng/dL, hematocrit 45 PSA was not checked by PCP this year  Currently injecting 100 mg weekly Notes increased tiredness and fatigue.  He is undergone sleep studies x2  No bothersome LUTS   PMH: Past Medical History:  Diagnosis Date   ADD (attention deficit disorder)    Anxiety    Bipolar 1 disorder (HCC)    BPH (benign prostatic hyperplasia)    DDD (degenerative disc disease), cervical    Depression    Erectile dysfunction    GERD (gastroesophageal reflux disease)    Gout    HLD (hyperlipidemia)    HTN (hypertension)    Hypogonadism in male    Lymphedema    Nocturia    Obesity    Obesity    Osteoarthritis    Peripheral neuropathy    feet   S/P insertion of spinal cord stimulator    Sleep apnea    CPAP   Tinea cruris     Surgical History: Past Surgical History:  Procedure Laterality Date   BACK SURGERY     CARDIAC CATHETERIZATION     COLONOSCOPY WITH PROPOFOL N/A 06/25/2017   Procedure: COLONOSCOPY WITH PROPOFOL;  Surgeon: Lucilla Lame, MD;  Location: [facility];  Service: Endoscopy;  Laterality: N/A;  sleep apnea   JOINT REPLACEMENT     x 3   LUMBAR DISC SURGERY     x 3   POLYPECTOMY N/A 06/25/2017   Procedure: POLYPECTOMY;  Surgeon: Lucilla Lame, MD;  Location: [facility];  Service: Endoscopy;  Laterality: N/A;   STOMACH SURGERY     gastric sleeve   TOE SURGERY     TOTAL KNEE ARTHROPLASTY Left 6244   UMBILICAL HERNIA REPAIR N/A 02/17/2016   Procedure: HERNIA REPAIR UMBILICAL  ADULT;  Surgeon: Florene Glen, MD;  Location: [facility];  Service: General;  Laterality: N/A;   WRIST SURGERY      Home Medications:  Allergies as of 04/03/2022       Reactions   Cyanoacrylate Hives   Wound Dressing Adhesive Hives   Atorvastatin Other (See Comments)   Leg pain and severe headaches   Demerol [meperidine]    Interacts with other medication, causes blood pressure to raise   Statins Other (See Comments)   Headaches/migraines   Other Swelling, Rash   dermabond        Medication List        Accurate as of April 03, 2022  1:49 PM. If you have any questions, ask your nurse or doctor.          anastrozole 1 MG tablet Commonly known as: ARIMIDEX Take 1 tablet by mouth once daily   ARIPiprazole 15 MG tablet Commonly known as: ABILIFY Take by mouth.   aspirin 81 MG chewable tablet Chew 81 mg by mouth daily.   BARIATRIC MULTIVITAMINS/IRON PO Take by mouth.   Cholecalciferol 50 MCG (2000 UT) Tabs Take 2,000 Units by  mouth daily. Reported on 02/03/2016   ezetimibe 10 MG tablet Commonly known as: ZETIA Take 1 tablet by mouth daily.   fentaNYL 25 MCG/HR Commonly known as: [place] onto the skin.   lamoTRIgine 200 MG tablet Commonly known as: LAMICTAL Take 200 mg by mouth 2 (two) times daily. Reported on 09/22/2015   lisinopril 10 MG tablet Commonly known as: ZESTRIL Take 10 mg by mouth daily.   metoprolol succinate 100 MG 24 hr tablet Commonly known as: TOPROL-XL Take 1 tablet by mouth daily.   Narcan 4 MG/0.1ML Liqd nasal spray kit Generic drug: naloxone CALL 911. ADMINISTER A SINGLE SPRAY OF NARCAN IN ONE NOSTRIL. REPEAT EVERY 3 MINUTES AS NEEDED IF NO OR MINIMAL RESPONSE.   oxyCODONE 15 MG immediate release tablet Commonly known as: ROXICODONE Take 15 mg by mouth every 4 (four) hours.   pantoprazole 40 MG tablet Commonly known as: PROTONIX Take 1 tablet by mouth daily.   Relief Knee Misc Use as directed   sildenafil 100 MG  tablet Commonly known as: VIAGRA TAKE 1 TABLET BY MOUTH  AS NEEDED FOR  ERECTILE  DYSFUNCTION   testosterone cypionate 200 MG/ML injection Commonly known as: DEPOTESTOSTERONE CYPIONATE Inject 0.5 mLs (100 mg total) into the muscle once a week. What changed: Another medication with the same name was removed. Continue taking this medication, and follow the directions you see here. Changed by: Abbie Sons, MD   tranylcypromine 10 MG tablet Commonly known as: PARNATE Take 30 mg by mouth 3 (three) times daily. 9 tablets daily   VITAMIN B-12 SL Place under the tongue daily.        Allergies:  Allergies  Allergen Reactions   Cyanoacrylate Hives   Wound Dressing Adhesive Hives   Atorvastatin Other (See Comments)    Leg pain and severe headaches   Demerol [Meperidine]     Interacts with other medication, causes blood pressure to raise   Statins Other (See Comments)    Headaches/migraines   Other Swelling and Rash    dermabond    Family History: Family History  Problem Relation Age of Onset   Arthritis Mother    Deep vein thrombosis Mother    Uterine cancer Mother    Heart disease Mother    Cirrhosis Sister    Heart disease Father    Diabetes Mellitus II Father        Mother, Sister   Parkinson's disease Father    Stroke Father    Kidney disease Neg Hx    Prostate cancer Neg Hx     Social History:  reports that he has never smoked. He has never used smokeless tobacco. He reports current alcohol use of about 2.0 standard drinks of alcohol per week. He reports that he does not use drugs.   Physical Exam: BP 126/74   Pulse 83   Ht 6' 3"  (1.905 m)   Wt (!) 340 lb (154.2 kg)   BMI 42.50 kg/m   Constitutional:  Alert and oriented, No acute distress. HEENT: [place] AT so the easier than taking a tablet of the primary issue with knees is caused Respiratory: Normal respiratory effort, no increased work of breathing.  Different formularies have Gummies as well which a  lot of patients like to 66 take Still has aSkin: No rashes, bruises or suspicious lesions. Neurologic: Grossly intact, no focal deficits, moving all 4 extremities. Psychiatric: Normal mood and affect.   Assessment & Plan:    1.  Hypogonadism Feels he  does better with his testosterone level in the 700 range Will recheck a lab visit for testosterone and PSA in 1 month We discussed that TRT can worsen sleep apnea and he will have the results of his study within a month Lab visit 6 months for testosterone/hematocrit and 1 year testosterone/hematocrit/PSA and DRE  2.  Erectile dysfunction Stable   Abbie Sons, MD  [facility] [address] [place], [place] [address] [phone]

## 2022-04-06 ENCOUNTER — Encounter: Payer: Self-pay | Admitting: Urology

## 2022-05-04 ENCOUNTER — Other Ambulatory Visit: Payer: BC Managed Care – PPO

## 2022-05-04 DIAGNOSIS — E291 Testicular hypofunction: Secondary | ICD-10-CM

## 2022-05-04 DIAGNOSIS — Z125 Encounter for screening for malignant neoplasm of prostate: Secondary | ICD-10-CM

## 2022-05-05 LAB — HEMATOCRIT: Hematocrit: 44.8 % (ref 37.5–51.0)

## 2022-05-05 LAB — PSA: Prostate Specific Ag, Serum: 0.6 ng/mL (ref 0.0–4.0)

## 2022-05-05 LAB — TESTOSTERONE: Testosterone: 719 ng/dL (ref 264–916)

## 2022-05-07 ENCOUNTER — Encounter: Payer: Self-pay | Admitting: Urology

## 2022-05-09 ENCOUNTER — Other Ambulatory Visit: Payer: Self-pay | Admitting: Urology

## 2022-05-09 MED ORDER — TESTOSTERONE CYPIONATE 200 MG/ML IM SOLN: 140.0000 mg | INTRAMUSCULAR | 0 refills | Status: DC

## 2022-06-03 ENCOUNTER — Other Ambulatory Visit: Payer: Self-pay | Admitting: Urology

## 2022-06-12 ENCOUNTER — Encounter (INDEPENDENT_AMBULATORY_CARE_PROVIDER_SITE_OTHER): Payer: Self-pay

## 2022-06-23 ENCOUNTER — Other Ambulatory Visit: Payer: BC Managed Care – PPO

## 2022-06-23 DIAGNOSIS — E291 Testicular hypofunction: Secondary | ICD-10-CM

## 2022-06-24 LAB — TESTOSTERONE: Testosterone: 683 ng/dL (ref 264–916)

## 2022-06-26 ENCOUNTER — Other Ambulatory Visit: Payer: BC Managed Care – PPO

## 2022-06-26 ENCOUNTER — Other Ambulatory Visit: Payer: Self-pay | Admitting: Urology

## 2022-06-26 DIAGNOSIS — N5201 Erectile dysfunction due to arterial insufficiency: Secondary | ICD-10-CM

## 2022-06-26 MED ORDER — SILDENAFIL CITRATE 100 MG PO TABS: ORAL_TABLET | ORAL | 0 refills | Status: DC

## 2022-07-03 ENCOUNTER — Ambulatory Visit (INDEPENDENT_AMBULATORY_CARE_PROVIDER_SITE_OTHER): Payer: BC Managed Care – PPO | Admitting: Internal Medicine

## 2022-07-03 VITALS — BP 129/84 | HR 81 | Resp 16 | Ht 75.0 in | Wt 342.0 lb

## 2022-07-03 DIAGNOSIS — Z7189 Other specified counseling: Secondary | ICD-10-CM | POA: Diagnosis not present

## 2022-07-03 DIAGNOSIS — G4731 Primary central sleep apnea: Secondary | ICD-10-CM | POA: Diagnosis not present

## 2022-07-03 NOTE — Progress Notes (Signed)
[facility] [place], [place] [address]  Pulmonary Sleep Medicine   Office Visit Note  Patient Name: Rodney Howard DOB: Feb 02, 1966 MRN [id number]    Chief Complaint: Obstructive Sleep Apnea visit  Brief History:  Rodney Howard is seen today for follow up 2 months after new setup on ASV. The patient has a 10+ year history of sleep apnea. Patient is using PAP nightly.  The patient feels rested after sleeping with PAP.  The patient reports benefiting from PAP use. Reported sleepiness is  improved and the Epworth Sleepiness Score is 8 out of 24. The patient does not take naps. The patient complains of the following: He was oral venting during his sleep study and tried a full face mask but felt like it was uncomfortable.  He has switched back to a nasal mask with a chinstrap.  The compliance download shows 89% compliance with an average use time of 6 hours 24 minutes. The AHI is 8.1.  The patient does not complain of limb movements disrupting sleep.  ROS  General: (-) fever, (-) chills, (-) night sweat Nose and Sinuses: (-) nasal stuffiness or itchiness, (-) postnasal drip, (-) nosebleeds, (-) sinus trouble. Mouth and Throat: (-) sore throat, (-) hoarseness. Neck: (-) swollen glands, (-) enlarged thyroid, (-) neck pain. Respiratory: - cough, - shortness of breath, - wheezing. Neurologic: - numbness, + tingling. Psychiatric: +anxiety, - depression   Current Medication: Outpatient Encounter Medications as of 07/03/2022  Medication Sig   busPIRone (BUSPAR) 30 MG tablet Take by mouth.   anastrozole (ARIMIDEX) 1 MG tablet Take 1 tablet by mouth once daily   ARIPiprazole (ABILIFY) 15 MG tablet Take by mouth.   aspirin 81 MG chewable tablet Chew 81 mg by mouth daily.    Cholecalciferol 2000 UNITS TABS Take 2,000 Units by mouth daily. Reported on 02/03/2016   Cyanocobalamin (VITAMIN B-12 SL) Place under the tongue daily.   Elastic Bandages & Supports (RELIEF KNEE) MISC  Use as directed   ezetimibe (ZETIA) 10 MG tablet Take 1 tablet by mouth daily.   fentaNYL (DURAGESIC) 25 MCG/HR Place onto the skin.   lamoTRIgine (LAMICTAL) 200 MG tablet Take 200 mg by mouth 2 (two) times daily. Reported on 09/22/2015   lisinopril (ZESTRIL) 10 MG tablet Take 10 mg by mouth daily.   metoprolol succinate (TOPROL-XL) 100 MG 24 hr tablet Take 1 tablet by mouth daily.   modafinil (PROVIGIL) 200 MG tablet Take 200 mg by mouth 2 (two) times daily.   Multiple Vitamins-Minerals (BARIATRIC MULTIVITAMINS/IRON PO) Take by mouth.   NARCAN 4 MG/0.1ML LIQD nasal spray kit CALL 911. ADMINISTER A SINGLE SPRAY OF NARCAN IN ONE NOSTRIL. REPEAT EVERY 3 MINUTES AS NEEDED IF NO OR MINIMAL RESPONSE.   oxyCODONE (ROXICODONE) 15 MG immediate release tablet Take 15 mg by mouth every 4 (four) hours.   pantoprazole (PROTONIX) 40 MG tablet Take 1 tablet by mouth daily.   sildenafil (VIAGRA) 100 MG tablet TAKE 1 TABLET BY MOUTH  AS NEEDED FOR  ERECTILE  DYSFUNCTION   testosterone cypionate (DEPOTESTOSTERONE CYPIONATE) 200 MG/ML injection Inject 0.7 mLs (140 mg total) into the muscle once a week.   tranylcypromine (PARNATE) 10 MG tablet Take 30 mg by mouth 3 (three) times daily. 9 tablets daily   No facility-administered encounter medications on file as of 07/03/2022.    Surgical History: Past Surgical History:  Procedure Laterality Date   BACK SURGERY     CARDIAC CATHETERIZATION     COLONOSCOPY WITH  PROPOFOL N/A 06/25/2017   Procedure: COLONOSCOPY WITH PROPOFOL;  Surgeon: Lucilla Lame, MD;  Location: [facility];  Service: Endoscopy;  Laterality: N/A;  sleep apnea   JOINT REPLACEMENT     x 3   LUMBAR DISC SURGERY     x 3   POLYPECTOMY N/A 06/25/2017   Procedure: POLYPECTOMY;  Surgeon: Lucilla Lame, MD;  Location: [facility];  Service: Endoscopy;  Laterality: N/A;   STOMACH SURGERY     gastric sleeve   TOE SURGERY     TOTAL KNEE ARTHROPLASTY Left 7253   UMBILICAL HERNIA REPAIR  N/A 02/17/2016   Procedure: HERNIA REPAIR UMBILICAL ADULT;  Surgeon: Florene Glen, MD;  Location: [facility];  Service: General;  Laterality: N/A;   WRIST SURGERY      Medical History: Past Medical History:  Diagnosis Date   ADD (attention deficit disorder)    Anxiety    Bipolar 1 disorder (HCC)    BPH (benign prostatic hyperplasia)    DDD (degenerative disc disease), cervical    Depression    Erectile dysfunction    GERD (gastroesophageal reflux disease)    Gout    HLD (hyperlipidemia)    HTN (hypertension)    Hypogonadism in male    Lymphedema    Nocturia    Obesity    Obesity    Osteoarthritis    Peripheral neuropathy    feet   S/P insertion of spinal cord stimulator    Sleep apnea    CPAP   Tinea cruris     Family History: Non contributory to the present illness  Social History: Social History   Socioeconomic History   Marital status: Married    Spouse name: Not on file   Number of children: Not on file   Years of education: Not on file   Highest education level: Not on file  Occupational History   Not on file  Tobacco Use   Smoking status: Never   Smokeless tobacco: Never  Vaping Use   Vaping Use: Never used  Substance and Sexual Activity   Alcohol use: Yes    Alcohol/week: 2.0 standard drinks of alcohol    Types: 2 Cans of beer per week    Comment: occasional   Drug use: No   Sexual activity: Yes  Other Topics Concern   Not on file  Social History Narrative   Not on file   Social Determinants of Health   Financial Resource Strain: Not on file  Food Insecurity: Not on file  Transportation Needs: Not on file  Physical Activity: Not on file  Stress: Not on file  Social Connections: Not on file  Intimate Partner Violence: Not on file    Vital Signs: Blood pressure 129/84, pulse 81, resp. rate 16, height _0  (1.905 m), weight (!) 342 lb (155.1 kg), SpO2 95 %. Body mass index is 42.75 kg/m.    Examination: General Appearance: The  patient is well-developed, well-nourished, and in no distress. Neck Circumference: 49 cm Skin: Gross inspection of skin unremarkable. Head: normocephalic, no gross deformities. Eyes: no gross deformities noted. ENT: ears appear grossly normal Neurologic: Alert and oriented. No involuntary movements.  STOP BANG RISK ASSESSMENT S (snore) Have you been told that you snore?     NO   T (tired) Are you often tired, fatigued, or sleepy during the day?   NO  O (obstruction) Do you stop breathing, choke, or gasp during sleep? NO   P (pressure) Do you have  or are you being treated for high blood pressure? YES   B (BMI) Is your body index greater than 35 kg/m? YES   A (age) Are you 59 years old or older? YES   N (neck) Do you have a neck circumference greater than 16 inches?   YES   G (gender) Are you a male? YES   TOTAL STOP/BANG "YES" ANSWERS 5       A STOP-Bang score of 2 or less is considered low risk, and a score of 5 or more is high risk for having either moderate or severe OSA. For people who score 3 or 4, doctors may need to perform further assessment to determine how likely they are to have OSA.         EPWORTH SLEEPINESS SCALE:  Scale:  (0)= no chance of dozing; (1)= slight chance of dozing; (2)= moderate chance of dozing; (3)= high chance of dozing  Chance  Situtation    Sitting and reading: 1    Watching TV: 1    Sitting Inactive in public: 0    As a passenger in car: 2      Lying down to rest: 2    Sitting and talking: 1    Sitting quielty after lunch: 1    In a car, stopped in traffic: 0   TOTAL SCORE:   8 out of 24    SLEEP STUDIES:  PSG (03/08/22)  AHI 99.8, REM AHI 132.2, min SPO2 81%   CPAP COMPLIANCE DATA:  Date Range: 04/25/22 - 06/28/22  Average Daily Use: 6 hours 24 minutes  Median Use: 6 hours 29 minutes  Compliance for > 4 Hours: 58 days  AHI: 8.1 respiratory events per hour  Days Used: 65/65  Mask Leak: 31.2  95th Percentile  Pressure: ASV         LABS: Recent Results (from the past 2160 hour(s))  Hematocrit     Status: None   Collection Time: 05/04/22  9:50 AM  Result Value Ref Range   Hematocrit 44.8 37.5 - 51.0 %  PSA     Status: None   Collection Time: 05/04/22  9:50 AM  Result Value Ref Range   Prostate Specific Ag, Serum 0.6 0.0 - 4.0 ng/mL    Comment: Roche ECLIA methodology. According to the American Urological Association, Serum PSA should decrease and remain at undetectable levels after radical prostatectomy. The AUA defines biochemical recurrence as an initial PSA value 0.2 ng/mL or greater followed by a subsequent confirmatory PSA value 0.2 ng/mL or greater. Values obtained with different assay methods or kits cannot be used interchangeably. Results cannot be interpreted as absolute evidence of the presence or absence of malignant disease.   Testosterone     Status: None   Collection Time: 05/04/22  9:50 AM  Result Value Ref Range   Testosterone 719 264 - 916 ng/dL    Comment: Adult male reference interval is based on a population of healthy nonobese males (BMI <30) between 38 and 5 years old. [place], [place] [phone]. PMID: [id number]. **Verified by repeat analysis**   Testosterone     Status: None   Collection Time: 06/23/22  8:30 AM  Result Value Ref Range   Testosterone 683 264 - 916 ng/dL    Comment: Adult male reference interval is based on a population of healthy nonobese males (BMI <30) between 19 and 43 years old. [place], [place] [phone]. PMID: [id number].     Radiology: CT CERVICAL SPINE WO CONTRAST  Result Date: 07/01/2021 CLINICAL DATA:  Neck pain with upper extremity radiation and left-greater-than-right arm pain. EXAM: CT CERVICAL SPINE WITHOUT CONTRAST TECHNIQUE: Multidetector CT imaging of the cervical spine was performed without intravenous contrast. Multiplanar CT image reconstructions were also generated. COMPARISON:  None.  FINDINGS: Alignment: Straightened but otherwise normal. Skull base and vertebrae: No acute fracture. No primary bone lesion or focal pathologic process. The vertebra are normally mineralized and are normal in heights. There is cervical spondylosis beginning at C4. The images are grainy of low quality below the C4 level due to body habitus, technique and superimposition of the patient's shoulders. Soft tissues and spinal canal: No prevertebral fluid or swelling. No visible canal hematoma. Disc levels: There are normal disc heights at C2-3 and C3-4, mild disc space loss with small anterior and posterior endplate spurs at W0-9, moderate disc space loss and bidirectional osteophytes C5-6, and collapsed discs C6-7, C7-T1 and T1-T2 where there may in part be vertebral body ankylosis, but this is difficult to evaluate given the poor image resolution below C4. The endplate spurring is minimal below C5. There are disc osteophyte complexes with mild thecal sac encroachment at C4-5 and C5-6, with other levels showing no definitive significant encroachment on the thecal sac. There are mild uncinate joint and facet spurring changes at most levels. Secondary acquired foraminal stenosis is mild on the left at C3-4, bilaterally moderate C4-5. The other foramina appear clear. Upper chest: Negative. Other: None. IMPRESSION: 1. No evidence of fractures or malalignment.  Straightened lordosis. 2. Cervical spondylosis, with degenerative disc disease progressively from C4-5 down, with collapsed discs beginning at C6-7 continuing through the upper most thoracic levels imaged. Some of these vertebral bodies could be ankylosed but this is difficult to confirm because the image resolution is quite poor below C4. 3. Foraminal stenosis as described above. Electronically Signed   By: Telford Nab M.D.   On: 07/01/2021 02:16    No results found.  No results found.    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/02/2018   Superficial thrombophlebitis 10/16/2018   Chronic constipation 08/15/2018   Hemorrhoids that prolapse with straining, but retract spontaneously 08/15/2018   Long term current use of opiate analgesic 08/15/2018   Chronic venous insufficiency 02/26/2018   ADD (attention deficit disorder) 01/15/2018   Seborrheic dermatitis 12/05/2017   Obesity 10/26/2017   Lymphedema 10/26/2017   Personal history of colonic polyps    Rectal polyp    Postlaminectomy syndrome, lumbar 07/04/2016   Spondylolisthesis of lumbar region 07/04/2016   Peripheral nerve disease 81/19/1478   Umbilical hernia without obstruction and without gangrene    Pure hypercholesterolemia 02/01/2016   Arthritis, degenerative 02/01/2016   Adiposity 02/01/2016   Gout 02/01/2016   Acid reflux 02/01/2016   Essential (primary) hypertension 02/01/2016   Narrowing of intervertebral disc space 02/01/2016   Bipolar I disorder, most recent episode depressed ([place]) 02/01/2016   Other specified behavioral and emotional disorders with onset usually occurring in childhood and adolescence 02/01/2016   Encounter for preprocedural cardiovascular examination 11/09/2015   H/O total knee  replacement 10/25/2015   Morbid obesity ([place]) 10/25/2015   Loosening of knee joint prosthesis ([place]) 10/25/2015   Hypogonadism in male 09/22/2015   Erectile dysfunction of organic origin 09/22/2015   BPH with obstruction/lower urinary tract symptoms 09/22/2015   History of surgical procedure 04/22/2015   Acquired lymphedema 02/23/2014   Obstructive apnea  10/13/2013   Fatty infiltration of liver 10/07/2012   Arteriosclerosis of coronary artery 10/07/2012   CAD (coronary artery disease) 10/07/2012   1. Complex sleep apnea syndrome The patient does tolerate PAP and reports  benefit from PAP use. The patient was reminded how to clean equipment and advised to replace supplies routinely. The patient was also counselled on weight loss . The compliance is good. The AHI is 8.1, which is doing much better for him, considering the severity of his apnea. He is not able to tolerate a full face mask, he is using a nasal with a chin strap.   Complex sleep apnea- he is doing much better and I think his control is as good as we are likely to see considering his limitations of mask leak as he can't tolerate a full face mask and he is using narcotics chronically. He feels much better and we will continue at his present settings. F/u 69m.Marland KitchenBIPAP ASV is medically necessary to treat this patient's complex sleep apnea.   2. Morbid obesity ([place] Obesity Counseling: Had a lengthy discussion regarding patients BMI and weight issues. Patient was instructed on portion control as well as increased activity. Also discussed caloric restrictions with trying to maintain intake less than 2000 Kcal. Discussions were made in accordance with the 5As of weight management. Simple actions such as not eating late and if able to, taking a walk is suggested.   3. Encounter for BiPAP use counseling  Counseling: had a lengthy discussion with the patient regarding the importance of PAP therapy in management of the sleep apnea. Patient appears to understand the risk factor reduction and also understands the risks associated with untreated sleep apnea. Patient will try to make a good faith effort to remain compliant with therapy. Also instructed the patient on proper cleaning of the device including the water must be changed daily if possible and use of distilled water is preferred. Patient  understands that the machine should be regularly cleaned with appropriate recommended cleaning solutions that do not damage the PAP machine for example given white vinegar and water rinses. Other methods such as ozone treatment may not be as good as these simple methods to achieve cleaning.      General Counseling: I have discussed the findings of the evaluation and examination with TCoralyn Mark  I have also discussed any further diagnostic evaluation thatmay be needed or ordered today. Allante verbalizes understanding of the findings of todays visit. We also reviewed his medications today and discussed drug interactions and side effects including but not limited excessive drowsiness and altered mental states. We also discussed that there is always a risk not just to him but also people around him. he has been encouraged to call the office with any questions or concerns that should arise related to todays visit.  No orders of the defined types were placed in this encounter.       I have personally obtained a history, examined the patient, evaluated laboratory and imaging results, formulated the assessment and plan and placed orders. This patient was seen today by STressie Ellis PA-C in collaboration with Dr. SDevona Konig   SAllyne Gee MD  FCCP Diplomate ABMS Pulmonary Critical Care Medicine and Sleep Medicine

## 2022-07-03 NOTE — Patient Instructions (Signed)

## 2022-07-16 ENCOUNTER — Other Ambulatory Visit: Payer: Self-pay | Admitting: Urology

## 2022-07-17 MED ORDER — TESTOSTERONE CYPIONATE 200 MG/ML IM SOLN: 140.0000 mg | INTRAMUSCULAR | 0 refills | Status: DC

## 2022-09-04 ENCOUNTER — Other Ambulatory Visit: Payer: Self-pay | Admitting: Urology

## 2022-10-04 ENCOUNTER — Other Ambulatory Visit: Payer: Self-pay | Admitting: Family Medicine

## 2022-10-04 ENCOUNTER — Encounter: Payer: Self-pay | Admitting: Urology

## 2022-10-04 ENCOUNTER — Other Ambulatory Visit: Payer: BC Managed Care – PPO

## 2022-10-04 DIAGNOSIS — E291 Testicular hypofunction: Secondary | ICD-10-CM

## 2022-10-05 LAB — TESTOSTERONE: Testosterone: 989 ng/dL — ABNORMAL HIGH (ref 264–916)

## 2022-10-17 ENCOUNTER — Telehealth: Payer: Self-pay | Admitting: Urology

## 2022-10-17 NOTE — Telephone Encounter (Signed)
Pt wants to know if he needs creatnine done and if so, can he do this in [place]?  Also he has lab scheduled here and would like to go to [place].

## 2022-10-18 ENCOUNTER — Other Ambulatory Visit
Admission: RE | Admit: 2022-10-18 | Discharge: 2022-10-18 | Disposition: A | Discharge status: Home / Self Care | Payer: BC Managed Care – PPO | Attending: Urology | Admitting: Urology

## 2022-10-18 ENCOUNTER — Other Ambulatory Visit: Payer: Self-pay | Admitting: Urology

## 2022-10-18 DIAGNOSIS — E291 Testicular hypofunction: Secondary | ICD-10-CM | POA: Insufficient documentation

## 2022-10-18 LAB — HEMOGLOBIN AND HEMATOCRIT, BLOOD
HCT: 47.7 % (ref 39.0–52.0)
Hemoglobin: 16.6 g/dL (ref 13.0–17.0)

## 2022-10-18 NOTE — Telephone Encounter (Signed)
Notified patient he can go to the [facility] suit [address] and get his h&h done

## 2022-10-19 MED ORDER — TESTOSTERONE CYPIONATE 200 MG/ML IM SOLN: 140.0000 mg | INTRAMUSCULAR | 0 refills | Status: DC

## 2022-10-20 ENCOUNTER — Ambulatory Visit: Payer: BC Managed Care – PPO | Admitting: Podiatry

## 2022-12-01 ENCOUNTER — Other Ambulatory Visit: Payer: Self-pay | Admitting: Urology

## 2022-12-24 ENCOUNTER — Other Ambulatory Visit: Payer: Self-pay | Admitting: Urology

## 2022-12-24 DIAGNOSIS — N5201 Erectile dysfunction due to arterial insufficiency: Secondary | ICD-10-CM

## 2023-01-01 ENCOUNTER — Ambulatory Visit (INDEPENDENT_AMBULATORY_CARE_PROVIDER_SITE_OTHER): Payer: BC Managed Care – PPO | Admitting: Internal Medicine

## 2023-01-01 VITALS — BP 130/85 | HR 73 | Resp 16 | Ht 75.0 in | Wt 309.0 lb

## 2023-01-01 DIAGNOSIS — Z7189 Other specified counseling: Secondary | ICD-10-CM

## 2023-01-01 DIAGNOSIS — G4731 Primary central sleep apnea: Secondary | ICD-10-CM | POA: Diagnosis not present

## 2023-01-01 NOTE — Patient Instructions (Signed)

## 2023-01-01 NOTE — Progress Notes (Unsigned)
[facility] [address] [place], [place] [address]  Pulmonary Sleep Medicine   Office Visit Note  Patient Name: Rodney Howard DOB: 07-21-1966 MRN [id number]    Chief Complaint: Obstructive Sleep Apnea visit  Brief History:  Rodney Howard is seen today for a 6 month follow up on ASV.  The patient has a 10+ year history of sleep apnea. Patient is using PAP nightly.  The patient feels rested after sleeping with PAP.  The patient reports benefiting from PAP use. Reported sleepiness is  improved and the Epworth Sleepiness Score is 8 out of 24. The patient does occasionally take naps. The patient complains of the following: No complaints.  The compliance download shows 73% compliance with an average use time of 5:47 hours. The AHI is 15.5.  The patient does not complain of limb movements disrupting sleep. He reports he does have frequently interrupted sleep due to his cats. He also reports feeling significantly better since being on bipap asv.   ROS  General: (-) fever, (-) chills, (-) night sweat Nose and Sinuses: (-) nasal stuffiness or itchiness, (-) postnasal drip, (-) nosebleeds, (-) sinus trouble. Mouth and Throat: (-) sore throat, (-) hoarseness. Neck: (-) swollen glands, (-) enlarged thyroid, (-) neck pain. Respiratory: - cough, - shortness of breath, - wheezing. Neurologic: - numbness, - tingling. Psychiatric: - anxiety, - depression   Current Medication: Outpatient Encounter Medications as of 01/01/2023  Medication Sig   anastrozole (ARIMIDEX) 1 MG tablet Take 1 tablet by mouth once daily   ARIPiprazole (ABILIFY) 15 MG tablet Take by mouth.   aspirin 81 MG chewable tablet Chew 81 mg by mouth daily.    busPIRone (BUSPAR) 30 MG tablet Take by mouth.   Cholecalciferol 2000 UNITS TABS Take 2,000 Units by mouth daily. Reported on 02/03/2016   Cyanocobalamin (VITAMIN B-12 SL) Place under the tongue daily.   Elastic Bandages & Supports (RELIEF KNEE) MISC Use as directed    ezetimibe (ZETIA) 10 MG tablet Take 1 tablet by mouth daily.   fentaNYL (DURAGESIC) 25 MCG/HR Place onto the skin.   lamoTRIgine (LAMICTAL) 200 MG tablet Take 200 mg by mouth 2 (two) times daily. Reported on 09/22/2015   lisinopril (ZESTRIL) 10 MG tablet Take 10 mg by mouth daily.   metoprolol succinate (TOPROL-XL) 100 MG 24 hr tablet Take 1 tablet by mouth daily.   modafinil (PROVIGIL) 200 MG tablet Take 200 mg by mouth 2 (two) times daily.   Multiple Vitamins-Minerals (BARIATRIC MULTIVITAMINS/IRON PO) Take by mouth.   NARCAN 4 MG/0.1ML LIQD nasal spray kit CALL 911. ADMINISTER A SINGLE SPRAY OF NARCAN IN ONE NOSTRIL. REPEAT EVERY 3 MINUTES AS NEEDED IF NO OR MINIMAL RESPONSE.   oxyCODONE (ROXICODONE) 15 MG immediate release tablet Take 15 mg by mouth every 4 (four) hours.   pantoprazole (PROTONIX) 40 MG tablet Take 1 tablet by mouth daily.   sildenafil (VIAGRA) 100 MG tablet TAKE 1 TABLET BY MOUTH AS NEEDED FOR ERECTILE DYSFUNCTION   testosterone cypionate (DEPOTESTOSTERONE CYPIONATE) 200 MG/ML injection Inject 0.7 mLs (140 mg total) into the muscle once a week.   tranylcypromine (PARNATE) 10 MG tablet Take 30 mg by mouth 3 (three) times daily. 9 tablets daily   No facility-administered encounter medications on file as of 01/01/2023.    Surgical History: Past Surgical History:  Procedure Laterality Date   BACK SURGERY     CARDIAC CATHETERIZATION     COLONOSCOPY WITH PROPOFOL N/A 06/25/2017   Procedure: COLONOSCOPY WITH PROPOFOL;  Surgeon:  Midge Minium, MD;  Location[facility];  Service: Endoscopy;  Laterality: N/A;  sleep apnea   JOINT REPLACEMENT     x 3   LUMBAR DISC SURGERY     x 3   POLYPECTOMY N/A 06/25/2017   Procedure: POLYPECTOMY;  Surgeon: Midge Minium, MD;  Location: [facility];  Service: Endoscopy;  Laterality: N/A;   STOMACH SURGERY     gastric sleeve   TOE SURGERY     TOTAL KNEE ARTHROPLASTY Left 2014   UMBILICAL HERNIA REPAIR N/A 02/17/2016    Procedure: HERNIA REPAIR UMBILICAL ADULT;  Surgeon: Lattie Haw, MD;  Location: [facility];  Service: General;  Laterality: N/A;   WRIST SURGERY      Medical History: Past Medical History:  Diagnosis Date   ADD (attention deficit disorder)    Anxiety    Bipolar 1 disorder (HCC)    BPH (benign prostatic hyperplasia)    DDD (degenerative disc disease), cervical    Depression    Erectile dysfunction    GERD (gastroesophageal reflux disease)    Gout    HLD (hyperlipidemia)    HTN (hypertension)    Hypogonadism in male    Lymphedema    Nocturia    Obesity    Obesity    Osteoarthritis    Peripheral neuropathy    feet   S/P insertion of spinal cord stimulator    Sleep apnea    CPAP   Tinea cruris     Family History: Non contributory to the present illness  Social History: Social History   Socioeconomic History   Marital status: Married    Spouse name: Not on file   Number of children: Not on file   Years of education: Not on file   Highest education level: Not on file  Occupational History   Not on file  Tobacco Use   Smoking status: Never   Smokeless tobacco: Never  Vaping Use   Vaping Use: Never used  Substance and Sexual Activity   Alcohol use: Yes    Alcohol/week: 2.0 standard drinks of alcohol    Types: 2 Cans of beer per week    Comment: occasional   Drug use: No   Sexual activity: Yes  Other Topics Concern   Not on file  Social History Narrative   Not on file   Social Determinants of Health   Financial Resource Strain: Not on file  Food Insecurity: Not on file  Transportation Needs: Not on file  Physical Activity: Not on file  Stress: Not on file  Social Connections: Not on file  Intimate Partner Violence: Not on file    Vital Signs: Blood pressure 130/85, pulse 73, resp. rate 16, height 6\' 3"  (1.905 m), weight (!) 309 lb (140.2 kg), SpO2 95 %. Body mass index is 38.62 kg/m.    Examination: General Appearance: The patient is  well-developed, well-nourished, and in no distress. Neck Circumference: 49 cm Skin: Gross inspection of skin unremarkable. Head: normocephalic, no gross deformities. Eyes: no gross deformities noted. ENT: ears appear grossly normal Neurologic: Alert and oriented. No involuntary movements.  STOP BANG RISK ASSESSMENT S (snore) Have you been told that you snore?     NO   T (tired) Are you often tired, fatigued, or sleepy during the day?   NO  O (obstruction) Do you stop breathing, choke, or gasp during sleep? NO   P (pressure) Do you have or are you being treated for high blood pressure? YES  B (BMI) Is your body index greater than 35 kg/m? YES   A (age) Are you 63 years old or older? YES   N (neck) Do you have a neck circumference greater than 16 inches?   YES   G (gender) Are you a male? YES   TOTAL STOP/BANG "YES" ANSWERS 5       A STOP-Bang score of 2 or less is considered low risk, and a score of 5 or more is high risk for having either moderate or severe OSA. For people who score 3 or 4, doctors may need to perform further assessment to determine how likely they are to have OSA.         EPWORTH SLEEPINESS SCALE:  Scale:  (0)= no chance of dozing; (1)= slight chance of dozing; (2)= moderate chance of dozing; (3)= high chance of dozing  Chance  Situtation    Sitting and reading: 1    Watching TV: 1    Sitting Inactive in public: 1    As a passenger in car: 2      Lying down to rest: 2    Sitting and talking: 0    Sitting quielty after lunch: 1    In a car, stopped in traffic: 0   TOTAL SCORE:   8 out of 24    SLEEP STUDIES:  PSG (03/08/22) AHI 99.8, REM AHI 132.2, min SPO2 81%   CPAP COMPLIANCE DATA:  Date Range: 07/01/22 - 12/27/22  Average Daily Use: 5:47 hours  Median Use: 5:52 hours  Compliance for > 4 Hours: 132 days  AHI: 15.5 respiratory events per hour  Days Used: 176/180  Mask Leak: 44.3  95th Percentile Pressure:  ASV         LABS: Recent Results (from the past 2160 hour(s))  Testosterone     Status: Abnormal   Collection Time: 10/04/22  9:07 AM  Result Value Ref Range   Testosterone 989 (H) 264 - 916 ng/dL    Comment: Adult male reference interval is based on a population of healthy nonobese males (BMI <30) between 63 and 37 years old. Travison, et.al. JCEM [phone]. PMID: [id number].   Hemoglobin and hematocrit, blood     Status: None   Collection Time: 10/18/22 12:30 PM  Result Value Ref Range   Hemoglobin 16.6 13.0 - 17.0 g/dL   HCT 62.1 30.8 - 65.7 %    Comment: Performed at [facility], [address], [place], [place] [address]    Radiology: No results found.  No results found.  No results found.    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   Superficial thrombophlebitis 10/16/2018   Chronic constipation 08/15/2018   Hemorrhoids that prolapse with straining, but retract spontaneously 08/15/2018   Long term current use of opiate analgesic 08/15/2018   Chronic venous insufficiency 02/26/2018   ADD (attention deficit disorder) 01/15/2018   Seborrheic dermatitis 12/05/2017   Obesity 10/26/2017   Lymphedema 10/26/2017   Personal history of colonic polyps    Rectal polyp  Postlaminectomy syndrome, lumbar 07/04/2016   Spondylolisthesis of lumbar region 07/04/2016   Peripheral nerve disease 03/01/2016   Umbilical hernia without obstruction and without gangrene    Pure hypercholesterolemia  02/01/2016   Arthritis, degenerative 02/01/2016   Adiposity 02/01/2016   Gout 02/01/2016   Acid reflux 02/01/2016   Essential (primary) hypertension 02/01/2016   Narrowing of intervertebral disc space 02/01/2016   Bipolar I disorder, most recent episode depressed (HCC) 02/01/2016   Other specified behavioral and emotional disorders with onset usually occurring in childhood and adolescence 02/01/2016   Encounter for preprocedural cardiovascular examination 11/09/2015   H/O total knee replacement 10/25/2015   Morbid obesity (HCC) 10/25/2015   Loosening of knee joint prosthesis (HCC) 10/25/2015   Hypogonadism in male 09/22/2015   Erectile dysfunction of organic origin 09/22/2015   BPH with obstruction/lower urinary tract symptoms 09/22/2015   History of surgical procedure 04/22/2015   Acquired lymphedema 02/23/2014   Obstructive apnea 10/13/2013   Fatty infiltration of liver 10/07/2012   Arteriosclerosis of coronary artery 10/07/2012   CAD (coronary artery disease) 10/07/2012   1. Central sleep apnea The patient does tolerate PAP and reports significant  benefit from PAP use. He has very severe central apnea and is on chronic opioids. He is unable to tolerate sleeping on his side or elevating head of bed. His apneas have increased, and he has significant leak. He will try a chin strap and we will do a one month download. If this does not improve his events we can consider titration. He  is not interested or willing to try a full face mask at this time. The patient was reminded how to clean equipment and advised to replace supplies routinely. The patient was also counselled on weight loss. The compliance is fair. The AHI is 15.5.   Central sleep apnea- severe, not controlled. Chin strap, f/u download 4w, consider titration if events still uncontrolled when leak is better. He is unable to sleep laterally or raise head of bed, but he will use a wedge support if able. F/u 54m  2. Encounter for  BiPAP use counseling  Counseling: had a lengthy discussion with the patient regarding the importance of PAP therapy in management of the sleep apnea. Patient appears to understand the risk factor reduction and also understands the risks associated with untreated sleep apnea. Patient will try to make a good faith effort to remain compliant with therapy. Also instructed the patient on proper cleaning of the device including the water must be changed daily if possible and use of distilled water is preferred. Patient understands that the machine should be regularly cleaned with appropriate recommended cleaning solutions that do not damage the PAP machine for example given white vinegar and water rinses. Other methods such as ozone treatment may not be as good as these simple methods to achieve cleaning.   3. Morbid obesity (HCC) Obesity Counseling: Had a lengthy discussion regarding patients BMI and weight issues. Patient was instructed on portion control as well as increased activity. Also discussed caloric restrictions with trying to maintain intake less than 2000 Kcal. Discussions were made in accordance with the 5As of weight management. Simple actions such as not eating late and if able to, taking a walk is suggested.     General Counseling: I have discussed the findings of the evaluation and examination with Aurther Loft.  I have also discussed any further diagnostic evaluation thatmay be needed or ordered today. Erron verbalizes understanding of the findings of todays visit. We also  reviewed his medications today and discussed drug interactions and side effects including but not limited excessive drowsiness and altered mental states. We also discussed that there is always a risk not just to him but also people around him. he has been encouraged to call the office with any questions or concerns that should arise related to todays visit.  No orders of the defined types were placed in this encounter.       I  have personally obtained a history, examined the patient, evaluated laboratory and imaging results, formulated the assessment and plan and placed orders. This patient was seen today by Emmaline Kluver, PA-C in collaboration with Dr. Freda Munro.   Yevonne Pax, MD [facility] Diplomate ABMS Pulmonary Critical Care Medicine and Sleep Medicine

## 2023-01-26 ENCOUNTER — Encounter: Payer: Self-pay | Admitting: Urology

## 2023-01-29 ENCOUNTER — Other Ambulatory Visit: Payer: Self-pay | Admitting: Urology

## 2023-01-29 ENCOUNTER — Encounter: Payer: Self-pay | Admitting: Urology

## 2023-01-29 DIAGNOSIS — E291 Testicular hypofunction: Secondary | ICD-10-CM

## 2023-02-26 ENCOUNTER — Other Ambulatory Visit: Payer: Self-pay | Admitting: Urology

## 2023-03-29 ENCOUNTER — Encounter: Payer: Self-pay | Admitting: Urology

## 2023-03-30 ENCOUNTER — Other Ambulatory Visit: Payer: Self-pay | Admitting: *Deleted

## 2023-03-30 DIAGNOSIS — E291 Testicular hypofunction: Secondary | ICD-10-CM

## 2023-03-30 DIAGNOSIS — Z125 Encounter for screening for malignant neoplasm of prostate: Secondary | ICD-10-CM

## 2023-04-02 ENCOUNTER — Other Ambulatory Visit: Payer: BC Managed Care – PPO

## 2023-04-04 ENCOUNTER — Ambulatory Visit: Payer: BC Managed Care – PPO | Admitting: Urology

## 2023-04-05 ENCOUNTER — Other Ambulatory Visit: Payer: BC Managed Care – PPO

## 2023-04-06 ENCOUNTER — Other Ambulatory Visit
Admission: RE | Admit: 2023-04-06 | Discharge: 2023-04-06 | Disposition: A | Discharge status: Home / Self Care | Payer: BC Managed Care – PPO | Attending: Urology | Admitting: Urology

## 2023-04-06 DIAGNOSIS — Z125 Encounter for screening for malignant neoplasm of prostate: Secondary | ICD-10-CM | POA: Insufficient documentation

## 2023-04-06 DIAGNOSIS — E291 Testicular hypofunction: Secondary | ICD-10-CM | POA: Diagnosis present

## 2023-04-06 LAB — HEMOGLOBIN AND HEMATOCRIT, BLOOD
HCT: 46.6 % (ref 39.0–52.0)
Hemoglobin: 16.2 g/dL (ref 13.0–17.0)

## 2023-04-06 LAB — PSA: Prostatic Specific Antigen: 0.73 ng/mL (ref 0.00–4.00)

## 2023-04-08 LAB — TESTOSTERONE: Testosterone: 779 ng/dL (ref 264–916)

## 2023-04-11 ENCOUNTER — Ambulatory Visit: Payer: BC Managed Care – PPO | Admitting: Urology

## 2023-04-11 ENCOUNTER — Encounter: Payer: Self-pay | Admitting: Urology

## 2023-04-11 VITALS — BP 102/67 | HR 80 | Ht 75.0 in | Wt 291.0 lb

## 2023-04-11 DIAGNOSIS — E291 Testicular hypofunction: Secondary | ICD-10-CM | POA: Diagnosis not present

## 2023-04-11 DIAGNOSIS — Z125 Encounter for screening for malignant neoplasm of prostate: Secondary | ICD-10-CM

## 2023-04-11 DIAGNOSIS — N529 Male erectile dysfunction, unspecified: Secondary | ICD-10-CM

## 2023-04-11 DIAGNOSIS — N5201 Erectile dysfunction due to arterial insufficiency: Secondary | ICD-10-CM | POA: Diagnosis not present

## 2023-04-11 NOTE — Progress Notes (Signed)
I, Rodney Howard, acting as a Neurosurgeon for Rodney Altes, MD., have documented all relevant documentation on the behalf of Rodney Altes, MD, as directed by  Rodney Altes, MD while in the presence of Rodney Altes, MD.  04/11/2023 2:34 PM   Rodney Howard 1966/06/28 [id number]  Referring provider: Rolm Gala, MD [address], [address] [place],  [place] [address]  Chief Complaint  Patient presents with   Hypogonadism    Urologic history:  1.  Hypogonadism             -On TRT testosterone cypionate, anastrozole   2.  Erectile dysfunction             -PDE 5 inhibitor   HPI: 57 y.o. male presents for annual follow-up of hypogonadism.  Injecting 140 mg weekly. Labs 04/06/23 testosterone 779, PSA 0.73, H/H 16.2/46/6. Good energy level/libido. He had messaged in June 2024 regarding a decreased testicular size and the posibility of going on HCG. After learning about the expense of this medication, he elected not to.   PMH: Past Medical History:  Diagnosis Date   ADD (attention deficit disorder)    Anxiety    Bipolar 1 disorder (HCC)    BPH (benign prostatic hyperplasia)    DDD (degenerative disc disease), cervical    Depression    Erectile dysfunction    GERD (gastroesophageal reflux disease)    Gout    HLD (hyperlipidemia)    HTN (hypertension)    Hypogonadism in male    Lymphedema    Nocturia    Obesity    Obesity    Osteoarthritis    Peripheral neuropathy    feet   S/P insertion of spinal cord stimulator    Sleep apnea    CPAP   Tinea cruris     Surgical History: Past Surgical History:  Procedure Laterality Date   BACK SURGERY     CARDIAC CATHETERIZATION     COLONOSCOPY WITH PROPOFOL N/A 06/25/2017   Procedure: COLONOSCOPY WITH PROPOFOL;  Surgeon: Rodney Minium, MD;  Location: [facility];  Service: Endoscopy;  Laterality: N/A;  sleep apnea   JOINT REPLACEMENT     x 3   LUMBAR DISC SURGERY     x 3   POLYPECTOMY N/A 06/25/2017    Procedure: POLYPECTOMY;  Surgeon: Rodney Minium, MD;  Location: [facility];  Service: Endoscopy;  Laterality: N/A;   STOMACH SURGERY     gastric sleeve   TOE SURGERY     TOTAL KNEE ARTHROPLASTY Left 2014   UMBILICAL HERNIA REPAIR N/A 02/17/2016   Procedure: HERNIA REPAIR UMBILICAL ADULT;  Surgeon: Rodney Haw, MD;  Location: [facility];  Service: General;  Laterality: N/A;   WRIST SURGERY      Home Medications:  Allergies as of 04/11/2023       Reactions   Cyanoacrylate Hives   Wound Dressing Adhesive Hives   Atorvastatin Other (See Comments)   Leg pain and severe headaches   Demerol [meperidine]    Interacts with other medication, causes blood pressure to raise   Statins Other (See Comments)   Headaches/migraines   Other Swelling, Rash   dermabond        Medication List        Accurate as of April 11, 2023  2:34 PM. If you have any questions, ask your nurse or doctor.          anastrozole 1 MG tablet Commonly known as:  ARIMIDEX Take 1 tablet by mouth once daily   ARIPiprazole 15 MG tablet Commonly known as: ABILIFY Take by mouth.   aspirin 81 MG chewable tablet Chew 81 mg by mouth daily.   BARIATRIC MULTIVITAMINS/IRON PO Take by mouth.   busPIRone 30 MG tablet Commonly known as: BUSPAR Take by mouth.   Cholecalciferol 50 MCG (2000 UT) Tabs Take 2,000 Units by mouth daily. Reported on 02/03/2016   ezetimibe 10 MG tablet Commonly known as: ZETIA Take 1 tablet by mouth daily.   fentaNYL 25 MCG/HR Commonly known as: DURAGESIC Place onto the skin.   lamoTRIgine 200 MG tablet Commonly known as: LAMICTAL Take 200 mg by mouth 2 (two) times daily. Reported on 09/22/2015   lisinopril 10 MG tablet Commonly known as: ZESTRIL Take 10 mg by mouth daily.   metoprolol succinate 100 MG 24 hr tablet Commonly known as: TOPROL-XL Take 1 tablet by mouth daily.   modafinil 200 MG tablet Commonly known as: PROVIGIL Take 200 mg by mouth 2 (two) times  daily.   Narcan 4 MG/0.1ML Liqd nasal spray kit Generic drug: naloxone CALL 911. ADMINISTER A SINGLE SPRAY OF NARCAN IN ONE NOSTRIL. REPEAT EVERY 3 MINUTES AS NEEDED IF NO OR MINIMAL RESPONSE.   oxyCODONE 15 MG immediate release tablet Commonly known as: ROXICODONE Take 15 mg by mouth every 4 (four) hours.   pantoprazole 40 MG tablet Commonly known as: PROTONIX Take 1 tablet by mouth daily.   Relief Knee Misc Use as directed   sildenafil 100 MG tablet Commonly known as: VIAGRA TAKE 1 TABLET BY MOUTH AS NEEDED FOR ERECTILE DYSFUNCTION   testosterone cypionate 200 MG/ML injection Commonly known as: DEPOTESTOSTERONE CYPIONATE INJECT 0.7MLS (140MG  TOTAL) INTO THE MUSCLE ONCE A WEEK   tranylcypromine 10 MG tablet Commonly known as: PARNATE Take 30 mg by mouth 3 (three) times daily. 9 tablets daily   VITAMIN B-12 SL Place under the tongue daily.        Allergies:  Allergies  Allergen Reactions   Cyanoacrylate Hives   Wound Dressing Adhesive Hives   Atorvastatin Other (See Comments)    Leg pain and severe headaches   Demerol [Meperidine]     Interacts with other medication, causes blood pressure to raise   Statins Other (See Comments)    Headaches/migraines   Other Swelling and Rash    dermabond    Family History: Family History  Problem Relation Age of Onset   Arthritis Mother    Deep vein thrombosis Mother    Uterine cancer Mother    Heart disease Mother    Cirrhosis Sister    Heart disease Father    Diabetes Mellitus II Father        Mother, Sister   Parkinson's disease Father    Stroke Father    Kidney disease Neg Hx    Prostate cancer Neg Hx     Social History:  reports that he has never smoked. He has never used smokeless tobacco. He reports current alcohol use of about 2.0 standard drinks of alcohol per week. He reports that he does not use drugs.   Physical Exam: BP 102/67   Pulse 80   Ht 6\' 3"  (1.905 m)   Wt 291 lb (132 kg)   BMI 36.37  kg/m   Constitutional:  Alert and oriented, No acute distress. HEENT: [place] AT Respiratory: Normal respiratory effort, no increased work of breathing. GU: Right testicular volume estimated at 15 cc, left 12 cc. Psychiatric: Normal mood and  affect.   Assessment & Plan:    1.  Hypogonadism Stable with good energy level and libido on TRT. Lab visit 6 months testosterone and H/H and 1 year testosterone, H/H, PSA.   2.  Erectile dysfunction Stable.      [facility] [address], [address] [place], [place] [address] [phone]

## 2023-05-13 ENCOUNTER — Other Ambulatory Visit: Payer: Self-pay | Admitting: Urology

## 2023-05-13 DIAGNOSIS — E291 Testicular hypofunction: Secondary | ICD-10-CM

## 2023-05-26 ENCOUNTER — Other Ambulatory Visit: Payer: Self-pay | Admitting: Urology

## 2023-07-06 NOTE — Progress Notes (Unsigned)
[facility] [address] [place], [place] [address]  Pulmonary Sleep Medicine   Office Visit Note  Patient Name: Rodney Howard DOB: November 12, 1965 MRN [id number]    Chief Complaint: Obstructive Sleep Apnea visit  Brief History:  Rodney Howard is seen today for a follow up visit for BiPAP ASV@ EPAP min 8 max 15, PS min 6 max 15 cmH2O. The patient has a 10+ year history of sleep apnea. Patient is using PAP nightly.  The patient feels rested after sleeping with PAP.  The patient reports benefiting from PAP use. Reported sleepiness is  improved and the Epworth Sleepiness Score is 11 out of 24. The patient does take naps daily. The patient complains of the following: none.  The compliance download shows 80% compliance with an average use time of 5 hours 54 minutes. The AHI is 13.5.  The patient does not complain of limb movements disrupting sleep. The patient continues to require PAP therapy in order to eliminate sleep apnea.   ROS  General: (-) fever, (-) chills, (-) night sweat Nose and Sinuses: (-) nasal stuffiness or itchiness, (-) postnasal drip, (-) nosebleeds, (-) sinus trouble. Mouth and Throat: (-) sore throat, (-) hoarseness. Neck: (-) swollen glands, (-) enlarged thyroid, (-) neck pain. Respiratory: - cough, - shortness of breath, - wheezing. Neurologic: - numbness, - tingling. Psychiatric: - anxiety, - depression   Current Medication: Outpatient Encounter Medications as of 07/09/2023  Medication Sig   metoprolol succinate (TOPROL-XL) 50 MG 24 hr tablet Take 1 tablet by mouth daily.   anastrozole (ARIMIDEX) 1 MG tablet Take 1 tablet by mouth once daily   ARIPiprazole (ABILIFY) 15 MG tablet Take by mouth.   aspirin 81 MG chewable tablet Chew 81 mg by mouth daily.    bumetanide (BUMEX) 1 MG tablet Take 1 mg by mouth daily as needed.   busPIRone (BUSPAR) 30 MG tablet Take by mouth.   celecoxib (CELEBREX) 200 MG capsule Take 200 mg by mouth 2 (two) times daily as needed.    Cholecalciferol 2000 UNITS TABS Take 2,000 Units by mouth daily. Reported on 02/03/2016   Cyanocobalamin (VITAMIN B-12 SL) Place under the tongue daily.   Elastic Bandages & Supports (RELIEF KNEE) MISC Use as directed   ezetimibe (ZETIA) 10 MG tablet Take 1 tablet by mouth daily.   fentaNYL (DURAGESIC) 25 MCG/HR Place onto the skin.   lamoTRIgine (LAMICTAL) 200 MG tablet Take 200 mg by mouth 2 (two) times daily. Reported on 09/22/2015   modafinil (PROVIGIL) 200 MG tablet Take 200 mg by mouth 2 (two) times daily.   Multiple Vitamins-Minerals (BARIATRIC MULTIVITAMINS/IRON PO) Take by mouth.   NARCAN 4 MG/0.1ML LIQD nasal spray kit CALL 911. ADMINISTER A SINGLE SPRAY OF NARCAN IN ONE NOSTRIL. REPEAT EVERY 3 MINUTES AS NEEDED IF NO OR MINIMAL RESPONSE.   oxyCODONE (ROXICODONE) 15 MG immediate release tablet Take 15 mg by mouth every 4 (four) hours.   pantoprazole (PROTONIX) 40 MG tablet Take 1 tablet by mouth daily.   sildenafil (VIAGRA) 100 MG tablet TAKE 1 TABLET BY MOUTH AS NEEDED FOR ERECTILE DYSFUNCTION   testosterone cypionate (DEPOTESTOSTERONE CYPIONATE) 200 MG/ML injection INJECT 0.7MLS (140MG  TOTAL) INTO THE MUSCLE ONCE A WEEK   tranylcypromine (PARNATE) 10 MG tablet Take 30 mg by mouth 3 (three) times daily. 9 tablets daily   [DISCONTINUED] lisinopril (ZESTRIL) 10 MG tablet Take 10 mg by mouth daily.   [DISCONTINUED] metoprolol succinate (TOPROL-XL) 100 MG 24 hr tablet Take 1 tablet by mouth daily.  No facility-administered encounter medications on file as of 07/09/2023.    Surgical History: Past Surgical History:  Procedure Laterality Date   BACK SURGERY     CARDIAC CATHETERIZATION     COLONOSCOPY WITH PROPOFOL N/A 06/25/2017   Procedure: COLONOSCOPY WITH PROPOFOL;  Surgeon: Midge Minium, MD;  Location: [facility];  Service: Endoscopy;  Laterality: N/A;  sleep apnea   JOINT REPLACEMENT     x 3   LUMBAR DISC SURGERY     x 3   POLYPECTOMY N/A 06/25/2017   Procedure:  POLYPECTOMY;  Surgeon: Midge Minium, MD;  Location: [facility];  Service: Endoscopy;  Laterality: N/A;   STOMACH SURGERY     gastric sleeve   TOE SURGERY     TOTAL KNEE ARTHROPLASTY Left 2014   UMBILICAL HERNIA REPAIR N/A 02/17/2016   Procedure: HERNIA REPAIR UMBILICAL ADULT;  Surgeon: Lattie Haw, MD;  Location: [facility];  Service: General;  Laterality: N/A;   WRIST SURGERY      Medical History: Past Medical History:  Diagnosis Date   ADD (attention deficit disorder)    Anxiety    Bipolar 1 disorder (HCC)    BPH (benign prostatic hyperplasia)    DDD (degenerative disc disease), cervical    Depression    Erectile dysfunction    GERD (gastroesophageal reflux disease)    Gout    HLD (hyperlipidemia)    HTN (hypertension)    Hypogonadism in male    Lymphedema    Nocturia    Obesity    Obesity    Osteoarthritis    Peripheral neuropathy    feet   S/P insertion of spinal cord stimulator    Sleep apnea    CPAP   Tinea cruris     Family History: Non contributory to the present illness  Social History: Social History   Socioeconomic History   Marital status: Married    Spouse name: Not on file   Number of children: Not on file   Years of education: Not on file   Highest education level: Not on file  Occupational History   Not on file  Tobacco Use   Smoking status: Never   Smokeless tobacco: Never  Vaping Use   Vaping status: Never Used  Substance and Sexual Activity   Alcohol use: Yes    Alcohol/week: 2.0 standard drinks of alcohol    Types: 2 Cans of beer per week    Comment: occasional   Drug use: No   Sexual activity: Yes  Other Topics Concern   Not on file  Social History Narrative   Not on file   Social Determinants of Health   Financial Resource Strain: Not on file  Food Insecurity: No Food Insecurity (11/25/2019)   [facility]   Hunger Vital Sign    Worried About Running  Out of Food in the Last Year: Never true    Ran Out of Food in the Last Year: Never true  Transportation Needs: Not on file  Physical Activity: Not on file  Stress: Not on file  Social Connections: Not on file  Intimate Partner Violence: Not on file    Vital Signs: Blood pressure 128/87, pulse 77, resp. rate 16, height 6\' 3"  (1.905 m), weight 297 lb (134.7 kg), SpO2 99%. Body mass index is 37.12 kg/m.    Examination: General Appearance: The patient is well-developed, well-nourished, and in no distress. Neck Circumference: 49 cm Skin: Gross inspection  of skin unremarkable. Head: normocephalic, no gross deformities. Eyes: no gross deformities noted. ENT: ears appear grossly normal Neurologic: Alert and oriented. No involuntary movements.  STOP BANG RISK ASSESSMENT S (snore) Have you been told that you snore?     NO   T (tired) Are you often tired, fatigued, or sleepy during the day?   NO  O (obstruction) Do you stop breathing, choke, or gasp during sleep? NO   P (pressure) Do you have or are you being treated for high blood pressure? YES   B (BMI) Is your body index greater than 35 kg/m? YES   A (age) Are you 23 years old or older? YES   N (neck) Do you have a neck circumference greater than 16 inches?   YES   G (gender) Are you a male? YES   TOTAL STOP/BANG "YES" ANSWERS 5       A STOP-Bang score of 2 or less is considered low risk, and a score of 5 or more is high risk for having either moderate or severe OSA. For people who score 3 or 4, doctors may need to perform further assessment to determine how likely they are to have OSA.         EPWORTH SLEEPINESS SCALE:  Scale:  (0)= no chance of dozing; (1)= slight chance of dozing; (2)= moderate chance of dozing; (3)= high chance of dozing  Chance  Situtation    Sitting and reading: 1    Watching TV: 2    Sitting Inactive in public: 2    As a passenger in car: 2      Lying down to rest: 3    Sitting and  talking: 1    Sitting quielty after lunch: 1    In a car, stopped in traffic: 0   TOTAL SCORE:   11 out of 24    SLEEP STUDIES:  PSG (02/2022) AHI 99.8/hr, REM AHI 132.2/hr, min SpO2 81% Titration (03/2022) 2 week trial of BiPAP ASV @ EPAP min 8 max 15, PS min 6 max 15, maxpress 25 cmH2O, RR 12 bpm.    CPAP COMPLIANCE DATA:  Date Range: 05/07/2023-07/05/2023  Average Daily Use: 5 hours 54 minutes  Median Use: 5 hours 58 minutes  Compliance for > 4 Hours: 80%  AHI: 13.5 respiratory events per hour  Days Used: 56/60 days  Mask Leak: 34.1  95th Percentile Pressure: 23.4/12.7         LABS: No results found for this or any previous visit (from the past 2160 hour(s)).  Radiology: No results found.  No results found.  No results found.    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   Superficial thrombophlebitis 10/16/2018   Chronic constipation 08/15/2018   Hemorrhoids that prolapse with straining, but retract spontaneously 08/15/2018   Long term current use of opiate analgesic 08/15/2018   Chronic venous insufficiency 02/26/2018   ADD (attention deficit disorder) 01/15/2018   Seborrheic dermatitis 12/05/2017   Obesity 10/26/2017   Lymphedema 10/26/2017   History of  colonic polyps    Rectal polyp    Postlaminectomy syndrome, lumbar 07/04/2016   Spondylolisthesis of lumbar region 07/04/2016   Peripheral nerve disease 03/01/2016   Umbilical hernia without obstruction and without gangrene     Pure hypercholesterolemia 02/01/2016   Arthritis, degenerative 02/01/2016   Adiposity 02/01/2016   Gout 02/01/2016   Acid reflux 02/01/2016   Essential (primary) hypertension 02/01/2016   Narrowing of intervertebral disc space 02/01/2016   Bipolar I disorder, most recent episode depressed (HCC) 02/01/2016   Other specified behavioral and emotional disorders with onset usually occurring in childhood and adolescence 02/01/2016   Encounter for preprocedural cardiovascular examination 11/09/2015   H/O total knee replacement 10/25/2015   Morbid obesity (HCC) 10/25/2015   Loosening of knee joint prosthesis (HCC) 10/25/2015   Hypogonadism in male 09/22/2015   Erectile dysfunction of organic origin 09/22/2015   BPH with obstruction/lower urinary tract symptoms 09/22/2015   History of surgical procedure 04/22/2015   Acquired lymphedema 02/23/2014   Obstructive apnea 10/13/2013   Fatty infiltration of liver 10/07/2012   Arteriosclerosis of coronary artery 10/07/2012   CAD (coronary artery disease) 10/07/2012    1. Central sleep apnea The patient does tolerate PAP and reports  benefit from PAP use. He has told me repeatedly that he feels much better since being put on ASV. He is not controlled, however, and there has been an 80 lb weight loss this year. He is unwilling to do a titration. We discussed that his echo needs to be updated in light of his ASV (ejection fraction was 55% on last test) but his cardiologist has not ordered. The patient insists he is not in a financial position to get any tests done.  We went over his download at length and discussed the risks of his apnea not being controlled and the patient remains insistent that he feels better and he is taking his chances as he will not do any testing. The patient was reminded how to clean equipment and advised to replace supplies routinely. The patient was also counselled on weight loss. The patient was accompanied by his daughter who  verbalized understanding of the above and had no questions that were unanswered. . The compliance is good. The AHI is 13.5.   Central sleep apnea- uncontrolled on ASV. Patient needs repeat titration but is refusing due to financial situation. He is urged to reconsider, at this time is only willing to schedule followup to revisit this in 26m.   2. Encounter for BiPAP use counseling Counseling: had a lengthy discussion with the patient regarding the importance of PAP therapy in management of the sleep apnea. Patient appears to understand the risk factor reduction and also understands the risks associated with untreated sleep apnea. Patient will try to make a good faith effort to remain compliant with therapy. Also instructed the patient on proper cleaning of the device including the water must be changed daily if possible and use of distilled water is preferred. Patient understands that the machine should be regularly cleaned with appropriate recommended cleaning solutions that do not damage the PAP machine for example given white vinegar and water rinses. Other methods such as ozone treatment may not be as good as these simple methods to achieve cleaning.     General Counseling: I have discussed the findings of the evaluation and examination with Aurther Loft.  I have also discussed any further diagnostic evaluation thatmay be needed or ordered today. Jamani verbalizes understanding of the findings of todays visit. We also  reviewed his medications today and discussed drug interactions and side effects including but not limited excessive drowsiness and altered mental states. We also discussed that there is always a risk not just to him but also people around him. he has been encouraged to call the office with any questions or concerns that should arise related to todays visit.  No orders of the defined types were placed in this encounter.       I have personally obtained a history, examined the patient,  evaluated laboratory and imaging results, formulated the assessment and plan and placed orders. This patient was seen today by Emmaline Kluver, PA-C in collaboration with Dr. Freda Munro.   Yevonne Pax, MD [facility] Diplomate ABMS Pulmonary Critical Care Medicine and Sleep Medicine

## 2023-07-09 ENCOUNTER — Ambulatory Visit (INDEPENDENT_AMBULATORY_CARE_PROVIDER_SITE_OTHER): Payer: BC Managed Care – PPO | Admitting: Internal Medicine

## 2023-07-09 VITALS — BP 128/87 | HR 77 | Resp 16 | Ht 75.0 in | Wt 297.0 lb

## 2023-07-09 DIAGNOSIS — Z7189 Other specified counseling: Secondary | ICD-10-CM | POA: Diagnosis not present

## 2023-07-09 DIAGNOSIS — G4731 Primary central sleep apnea: Secondary | ICD-10-CM

## 2023-07-09 NOTE — Patient Instructions (Signed)

## 2023-08-01 ENCOUNTER — Other Ambulatory Visit: Payer: Self-pay | Admitting: Urology

## 2023-08-01 DIAGNOSIS — N5201 Erectile dysfunction due to arterial insufficiency: Secondary | ICD-10-CM

## 2023-08-06 ENCOUNTER — Other Ambulatory Visit: Payer: Self-pay | Admitting: Urology

## 2023-08-06 DIAGNOSIS — E291 Testicular hypofunction: Secondary | ICD-10-CM

## 2023-08-09 ENCOUNTER — Encounter: Payer: Self-pay | Admitting: Urology

## 2023-08-10 ENCOUNTER — Other Ambulatory Visit: Payer: Self-pay

## 2023-08-10 DIAGNOSIS — E291 Testicular hypofunction: Secondary | ICD-10-CM

## 2023-08-10 NOTE — Telephone Encounter (Signed)
Patient advised that orders are placed and that [facility] is a [facility] base [facility] and no appointment is needed, asked patient to make himself a reminder to go to the lab on a friday

## 2023-09-17 ENCOUNTER — Other Ambulatory Visit: Payer: Self-pay | Admitting: Neurosurgery

## 2023-09-17 ENCOUNTER — Ambulatory Visit
Admission: RE | Admit: 2023-09-17 | Discharge: 2023-09-17 | Disposition: A | Discharge status: Home / Self Care | Payer: PRIVATE HEALTH INSURANCE | Source: Ambulatory Visit | Attending: Neurosurgery | Admitting: Neurosurgery

## 2023-09-17 ENCOUNTER — Ambulatory Visit
Admission: RE | Admit: 2023-09-17 | Discharge: 2023-09-17 | Disposition: A | Discharge status: Home / Self Care | Payer: PRIVATE HEALTH INSURANCE | Attending: Neurosurgery | Admitting: Neurosurgery

## 2023-09-17 DIAGNOSIS — T85192A Other mechanical complication of implanted electronic neurostimulator (electrode) of spinal cord, initial encounter: Secondary | ICD-10-CM

## 2023-09-24 ENCOUNTER — Ambulatory Visit (INDEPENDENT_AMBULATORY_CARE_PROVIDER_SITE_OTHER): Payer: 59 | Admitting: Internal Medicine

## 2023-09-24 VITALS — BP 137/87 | HR 88 | Resp 20 | Ht 75.0 in | Wt 292.3 lb

## 2023-09-24 DIAGNOSIS — Z7189 Other specified counseling: Secondary | ICD-10-CM

## 2023-09-24 DIAGNOSIS — G4731 Primary central sleep apnea: Secondary | ICD-10-CM | POA: Diagnosis not present

## 2023-09-24 NOTE — Progress Notes (Signed)
[facility] [address] [place], [place] [address]  Pulmonary Sleep Medicine   Office Visit Note  Patient Name: Rodney Howard DOB: 1965-12-24 MRN [id number]    Chief Complaint: Obstructive Sleep Apnea visit  Brief History:  Rodney Howard is seen today for a 3 month follow up on   BiPAP ASV Auto @ EPAP min 8 max 15, PS min 6 max 15 cmH20.  Auto rate. The patient has a 10+ year  history of sleep apnea. Patient is using PAP nightly.  The patient feels rested after sleeping with PAP.  The patient reports benefit from PAP use. He is now using the Hybrid mask and he is liking it better than initially.  Reported sleepiness is  improved and the Epworth Sleepiness Score is 9 out of 24. The patient does not take naps. The patient complains of the following: oral dryness. Suggested Xylimelt tablets. Patient is using humidity but takes meds that dry him out as well as has some mask leak.  The compliance download shows  59% compliance with an average use time of 5.5 hours. The AHI is 12.5  The patient does complain of limb movements disrupting sleep.  ROS  General: (-) fever, (-) chills, (-) night sweat Nose and Sinuses: (-) nasal stuffiness or itchiness, (-) postnasal drip, (-) nosebleeds, (-) sinus trouble. Mouth and Throat: (-) sore throat, (-) hoarseness. Neck: (-) swollen glands, (-) enlarged thyroid, (-) neck pain. Respiratory: - cough, - shortness of breath, - wheezing. Neurologic: + numbness, +tingling. Psychiatric: + anxiety, + depression   Current Medication: Outpatient Encounter Medications as of 09/24/2023  Medication Sig   anastrozole (ARIMIDEX) 1 MG tablet Take 1 tablet by mouth once daily   ARIPiprazole (ABILIFY) 15 MG tablet Take by mouth.   aspirin 81 MG chewable tablet Chew 81 mg by mouth daily.    bumetanide (BUMEX) 1 MG tablet Take 1 mg by mouth daily as needed.   busPIRone (BUSPAR) 30 MG tablet Take by mouth.   celecoxib (CELEBREX) 200 MG capsule Take 200 mg by  mouth 2 (two) times daily as needed.   Cholecalciferol 2000 UNITS TABS Take 2,000 Units by mouth daily. Reported on 02/03/2016   Cyanocobalamin (VITAMIN B-12 SL) Place under the tongue daily.   Elastic Bandages & Supports (RELIEF KNEE) MISC Use as directed   ezetimibe (ZETIA) 10 MG tablet Take 1 tablet by mouth daily.   fentaNYL (DURAGESIC) 25 MCG/HR Place onto the skin.   lamoTRIgine (LAMICTAL) 200 MG tablet Take 200 mg by mouth 2 (two) times daily. Reported on 09/22/2015   metoprolol succinate (TOPROL-XL) 50 MG 24 hr tablet Take 1 tablet by mouth daily.   modafinil (PROVIGIL) 200 MG tablet Take 200 mg by mouth 2 (two) times daily.   Multiple Vitamins-Minerals (BARIATRIC MULTIVITAMINS/IRON PO) Take by mouth.   NARCAN 4 MG/0.1ML LIQD nasal spray kit CALL 911. ADMINISTER A SINGLE SPRAY OF NARCAN IN ONE NOSTRIL. REPEAT EVERY 3 MINUTES AS NEEDED IF NO OR MINIMAL RESPONSE.   oxyCODONE (ROXICODONE) 15 MG immediate release tablet Take 15 mg by mouth every 4 (four) hours.   pantoprazole (PROTONIX) 40 MG tablet Take 1 tablet by mouth daily.   sildenafil (VIAGRA) 100 MG tablet TAKE 1 TABLET BY MOUTH AS NEEDED FOR ERECTILE DYSFUNCTION   testosterone cypionate (DEPOTESTOSTERONE CYPIONATE) 200 MG/ML injection INJECT 0.7MLS (140MG ) INTO THE MUSCLE ONCE A WEEK   tranylcypromine (PARNATE) 10 MG tablet Take 30 mg by mouth 3 (three) times daily. 9 tablets daily  No facility-administered encounter medications on file as of 09/24/2023.    Surgical History: Past Surgical History:  Procedure Laterality Date   BACK SURGERY     CARDIAC CATHETERIZATION     COLONOSCOPY WITH PROPOFOL N/A 06/25/2017   Procedure: COLONOSCOPY WITH PROPOFOL;  Surgeon: Midge Minium, MD;  Location: [facility];  Service: Endoscopy;  Laterality: N/A;  sleep apnea   JOINT REPLACEMENT     x 3   LUMBAR DISC SURGERY     x 3   POLYPECTOMY N/A 06/25/2017   Procedure: POLYPECTOMY;  Surgeon: Midge Minium, MD;  Location: [facility];  Service: Endoscopy;  Laterality: N/A;   STOMACH SURGERY     gastric sleeve   TOE SURGERY     TOTAL KNEE ARTHROPLASTY Left 2014   UMBILICAL HERNIA REPAIR N/A 02/17/2016   Procedure: HERNIA REPAIR UMBILICAL ADULT;  Surgeon: Lattie Haw, MD;  Location: [facility] ORS;  Service: General;  Laterality: N/A;   WRIST SURGERY      Medical History: Past Medical History:  Diagnosis Date   ADD (attention deficit disorder)    Anxiety    Bipolar 1 disorder (HCC)    BPH (benign prostatic hyperplasia)    DDD (degenerative disc disease), cervical    Depression    Erectile dysfunction    GERD (gastroesophageal reflux disease)    Gout    HLD (hyperlipidemia)    HTN (hypertension)    Hypogonadism in male    Lymphedema    Nocturia    Obesity    Obesity    Osteoarthritis    Peripheral neuropathy    feet   S/P insertion of spinal cord stimulator    Sleep apnea    CPAP   Tinea cruris     Family History: Non contributory to the present illness  Social History: Social History   Socioeconomic History   Marital status: Married    Spouse name: Not on file   Number of children: Not on file   Years of education: Not on file   Highest education level: Not on file  Occupational History   Not on file  Tobacco Use   Smoking status: Never   Smokeless tobacco: Never  Vaping Use   Vaping status: Never Used  Substance and Sexual Activity   Alcohol use: Yes    Alcohol/week: 2.0 standard drinks of alcohol    Types: 2 Cans of beer per week    Comment: occasional   Drug use: No   Sexual activity: Yes  Other Topics Concern   Not on file  Social History Narrative   Not on file   Social Drivers of Health   Financial Resource Strain: Not on file  Food Insecurity: No Food Insecurity (11/25/2019)   Received from [facility], [facility]   Hunger Vital Sign    Worried About Running Out of Food in the Last Year: Never true    Ran Out of Food in the Last  Year: Never true  Transportation Needs: Not on file  Physical Activity: Not on file  Stress: Not on file  Social Connections: Not on file  Intimate Partner Violence: Not on file    Vital Signs: There were no vitals taken for this visit. There is no height or weight on file to calculate BMI.    Examination: General Appearance: The patient is well-developed, well-nourished, and in no distress. Neck Circumference: 49cm Skin: Gross inspection of skin unremarkable. Head: normocephalic, no gross deformities.  Eyes: no gross deformities noted. ENT: ears appear grossly normal Neurologic: Alert and oriented. No involuntary movements.  STOP BANG RISK ASSESSMENT S (snore) Have you been told that you snore?     No   T (tired) Are you often tired, fatigued, or sleepy during the day?   NO  O (obstruction) Do you stop breathing, choke, or gasp during sleep? NO   P (pressure) Do you have or are you being treated for high blood pressure? YES   B (BMI) Is your body index greater than 35 kg/m? YES   A (age) Are you 52 years old or older? YES   N (neck) Do you have a neck circumference greater than 16 inches?   YES   G (gender) Are you a male? YES   TOTAL STOP/BANG "YES" ANSWERS 5       A STOP-Bang score of 2 or less is considered low risk, and a score of 5 or more is high risk for having either moderate or severe OSA. For people who score 3 or 4, doctors may need to perform further assessment to determine how likely they are to have OSA.         EPWORTH SLEEPINESS SCALE:  Scale:  (0)= no chance of dozing; (1)= slight chance of dozing; (2)= moderate chance of dozing; (3)= high chance of dozing  Chance  Situtation    Sitting and reading: 0    Watching TV: 1    Sitting Inactive in public: 1    As a passenger in car: 1      Lying down to rest: 3    Sitting and talking: 1    Sitting quielty after lunch: 2    In a car, stopped in traffic: 0   TOTAL SCORE:   9 out of  24    SLEEP STUDIES:  PSG - 02/2022 - AHI - 99.8/hr, REM AHI 132.2/hr, min Sp02 81% Titration - 03/2022 2 week trial of BiPAP ASV @ EPAP min 8 max 15, PS min 6 max 15, max press 25   CPAP COMPLIANCE DATA:  Date Range: 06/22/2023-09/19/2023  Average Daily Use: 5.5  hours  Median Use: 5.5 hrs  Compliance for > 4 Hours: 59% days  AHI: 12.5 respiratory events per hour  Days Used: 80/90  Mask Leak: 45  95th Percentile Pressure: 22.9/12.5         LABS: No results found for this or any previous visit (from the past 2160 hours).  Radiology: No results found.  No results found.  No results found.    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   Superficial thrombophlebitis 10/16/2018   Chronic constipation 08/15/2018   Hemorrhoids that prolapse with straining, but retract spontaneously 08/15/2018   Long term current use of opiate analgesic 08/15/2018   Chronic venous insufficiency 02/26/2018   ADD (attention deficit disorder) 01/15/2018   Seborrheic dermatitis 12/05/2017   Obesity 10/26/2017   Lymphedema 10/26/2017   History of colonic polyps    Rectal polyp    Postlaminectomy  syndrome, lumbar 07/04/2016   Spondylolisthesis of lumbar region 07/04/2016   Peripheral nerve disease 03/01/2016   Umbilical hernia without obstruction and without gangrene    Pure hypercholesterolemia 02/01/2016   Arthritis, degenerative 02/01/2016   Adiposity 02/01/2016   Gout 02/01/2016   Acid reflux 02/01/2016    Essential (primary) hypertension 02/01/2016   Narrowing of intervertebral disc space 02/01/2016   Bipolar I disorder, most recent episode depressed (HCC) 02/01/2016   Other specified behavioral and emotional disorders with onset usually occurring in childhood and adolescence 02/01/2016   Encounter for preprocedural cardiovascular examination 11/09/2015   H/O total knee replacement 10/25/2015   Morbid obesity (HCC) 10/25/2015   Loosening of knee joint prosthesis (HCC) 10/25/2015   Hypogonadism in male 09/22/2015   Erectile dysfunction of organic origin 09/22/2015   BPH with obstruction/lower urinary tract symptoms 09/22/2015   History of surgical procedure 04/22/2015   Acquired lymphedema 02/23/2014   Obstructive apnea 10/13/2013   Fatty infiltration of liver 10/07/2012   Arteriosclerosis of coronary artery 10/07/2012   CAD (coronary artery disease) 10/07/2012  1. Central sleep apnea (Primary) The patient does tolerate PAP and reports  benefit from PAP use. He is happy with his current mask. He does still have leak. His apnea is not consistently controlled. However, we have seen improvement in recent weeks since he has had some adjustments in his pain management. For now we will leave his settings as they are, and we will do a download in 4 weeks time. If his AHI is significantly elevated we can make some changes such as taking him out of auto mode and setting a rate.  The patient was reminded how to clean equipment and advised to replace supplies routinely. The patient was also counselled on weight loss. The compliance is fair. The AHI is 12.5, which is slightly improved. .   Complex sleep apnea with suboptimal control. Continue with current settings, follow up download in 4 weeks. Office visit in 37m  2. Encounter for BiPAP use counseling  Counseling: had a lengthy discussion with the patient regarding the importance of PAP therapy in management of the sleep apnea. Patient appears to  understand the risk factor reduction and also understands the risks associated with untreated sleep apnea. Patient will try to make a good faith effort to remain compliant with therapy. Also instructed the patient on proper cleaning of the device including the water must be changed daily if possible and use of distilled water is preferred. Patient understands that the machine should be regularly cleaned with appropriate recommended cleaning solutions that do not damage the PAP machine for example given white vinegar and water rinses. Other methods such as ozone treatment may not be as good as these simple methods to achieve cleaning.   3. Morbid obesity (HCC) Obesity Counseling: Had a lengthy discussion regarding patients BMI and weight issues. Patient was instructed on portion control as well as increased activity. Also discussed caloric restrictions with trying to maintain intake less than 2000 Kcal. Discussions were made in accordance with the 5As of weight management. Simple actions such as not eating late and if able to, taking a walk is suggested.      General Counseling: I have discussed the findings of the evaluation and examination with Rodney Howard.  I have also discussed any further diagnostic evaluation thatmay be needed or ordered today. Rodney Howard verbalizes understanding of the findings of todays visit. We also reviewed his medications today and discussed drug interactions and side effects including but not  limited excessive drowsiness and altered mental states. We also discussed that there is always a risk not just to him but also people around him. he has been encouraged to call the office with any questions or concerns that should arise related to todays visit.  No orders of the defined types were placed in this encounter.       I have personally obtained a history, examined the patient, evaluated laboratory and imaging results, formulated the assessment and plan and placed orders. This patient  was seen today by Emmaline Kluver, PA-C in collaboration with Dr. Freda Munro.   Yevonne Pax, MD [facility] Diplomate ABMS Pulmonary Critical Care Medicine and Sleep Medicine

## 2023-09-24 NOTE — Patient Instructions (Signed)

## 2023-09-28 ENCOUNTER — Other Ambulatory Visit
Admission: RE | Admit: 2023-09-28 | Discharge: 2023-09-28 | Disposition: A | Discharge status: Home / Self Care | Payer: 59 | Attending: Urology | Admitting: Urology

## 2023-09-28 DIAGNOSIS — E291 Testicular hypofunction: Secondary | ICD-10-CM | POA: Insufficient documentation

## 2023-09-28 LAB — HEMOGLOBIN AND HEMATOCRIT, BLOOD
HCT: 48.8 % (ref 39.0–52.0)
Hemoglobin: 16.8 g/dL (ref 13.0–17.0)

## 2023-09-30 LAB — ESTRADIOL: Estradiol: 7.3 pg/mL — ABNORMAL LOW (ref 7.6–42.6)

## 2023-09-30 LAB — TESTOSTERONE: Testosterone: 945 ng/dL — ABNORMAL HIGH (ref 264–916)

## 2023-10-22 ENCOUNTER — Other Ambulatory Visit: Payer: Self-pay | Admitting: Urology

## 2023-10-22 DIAGNOSIS — E291 Testicular hypofunction: Secondary | ICD-10-CM

## 2023-10-23 MED ORDER — TESTOSTERONE CYPIONATE 200 MG/ML IM SOLN: 140.0000 mg | INTRAMUSCULAR | 0 refills | Status: DC

## 2023-10-24 ENCOUNTER — Other Ambulatory Visit: Payer: Self-pay | Admitting: Urology

## 2023-10-24 MED ORDER — ANASTROZOLE 1 MG PO TABS: 1.0000 mg | ORAL_TABLET | Freq: Every day | ORAL | 0 refills | Status: DC

## 2023-12-12 ENCOUNTER — Other Ambulatory Visit: Payer: Self-pay | Admitting: Urology

## 2023-12-12 DIAGNOSIS — N5201 Erectile dysfunction due to arterial insufficiency: Secondary | ICD-10-CM

## 2024-02-05 ENCOUNTER — Other Ambulatory Visit: Payer: Self-pay | Admitting: Urology

## 2024-02-05 DIAGNOSIS — E291 Testicular hypofunction: Secondary | ICD-10-CM

## 2024-02-20 ENCOUNTER — Other Ambulatory Visit: Payer: Self-pay | Admitting: Urology

## 2024-04-03 ENCOUNTER — Other Ambulatory Visit: Payer: PRIVATE HEALTH INSURANCE

## 2024-04-07 ENCOUNTER — Other Ambulatory Visit
Admission: RE | Admit: 2024-04-07 | Discharge: 2024-04-07 | Disposition: A | Discharge status: Home / Self Care | Attending: Urology | Admitting: Urology

## 2024-04-07 DIAGNOSIS — Z125 Encounter for screening for malignant neoplasm of prostate: Secondary | ICD-10-CM | POA: Diagnosis present

## 2024-04-07 DIAGNOSIS — E291 Testicular hypofunction: Secondary | ICD-10-CM | POA: Insufficient documentation

## 2024-04-07 LAB — HEMOGLOBIN AND HEMATOCRIT, BLOOD
HCT: 44.8 % (ref 39.0–52.0)
Hemoglobin: 15.9 g/dL (ref 13.0–17.0)

## 2024-04-07 LAB — PSA: Prostatic Specific Antigen: 0.55 ng/mL (ref 0.00–4.00)

## 2024-04-08 ENCOUNTER — Encounter: Payer: Self-pay | Admitting: Urology

## 2024-04-08 ENCOUNTER — Ambulatory Visit: Payer: PRIVATE HEALTH INSURANCE | Admitting: Urology

## 2024-04-08 VITALS — BP 133/85 | HR 67 | Ht 75.0 in | Wt 279.0 lb

## 2024-04-08 DIAGNOSIS — E291 Testicular hypofunction: Secondary | ICD-10-CM | POA: Diagnosis not present

## 2024-04-08 LAB — TESTOSTERONE: Testosterone: 914 ng/dL (ref 264–916)

## 2024-04-08 NOTE — Progress Notes (Addendum)
 04/08/2024 1:01 PM   Rodney Howard [birth year] [id number]  Referring provider: Rojelio Loader, MD [address], [address] [place],  [place] [address]  Chief Complaint  Patient presents with   Hypogonadism   Urologic history:  1.  Hypogonadism On TRT testosterone  cypionate   2.  Erectile dysfunction PDE 5 inhibitor   HPI: Rodney Howard is a 58 y.o. male presents for annual follow-up.  Injecting 140 mg weekly. Labs 04/07/2024 testosterone  914, PSA 0. 55, H/H 15.9/44.8 Complains of decreased libido Discontinued anastrozole  1 month ago   PMH: Past Medical History:  Diagnosis Date   ADD (attention deficit disorder)    Anxiety    Bipolar 1 disorder (HCC)    BPH (benign prostatic hyperplasia)    DDD (degenerative disc disease), cervical    Depression    Erectile dysfunction    GERD (gastroesophageal reflux disease)    Gout    HLD (hyperlipidemia)    HTN (hypertension)    Hypogonadism in male    Lymphedema    Nocturia    Obesity    Obesity    Osteoarthritis    Peripheral neuropathy    feet   S/P insertion of spinal cord stimulator    Sleep apnea    CPAP   Tinea cruris     Surgical History: Past Surgical History:  Procedure Laterality Date   BACK SURGERY     CARDIAC CATHETERIZATION     COLONOSCOPY WITH PROPOFOL  N/A 06/25/2017   Procedure: COLONOSCOPY WITH PROPOFOL ;  Surgeon: Jinny Carmine, MD;  Location: [facility];  Service: Endoscopy;  Laterality: N/A;  sleep apnea   JOINT REPLACEMENT     x 3   LUMBAR DISC SURGERY     x 3   POLYPECTOMY N/A 06/25/2017   Procedure: POLYPECTOMY;  Surgeon: Jinny Carmine, MD;  Location: [facility];  Service: Endoscopy;  Laterality: N/A;   STOMACH SURGERY     gastric sleeve   TOE SURGERY     TOTAL KNEE ARTHROPLASTY Left 2014   UMBILICAL HERNIA REPAIR N/A 02/17/2016   Procedure: HERNIA REPAIR UMBILICAL ADULT;  Surgeon: Charlie FORBES Fell, MD;  Location: [facility] ORS;  Service: General;  Laterality: N/A;    WRIST SURGERY      Home Medications:  Allergies as of 04/08/2024       Reactions   Cyanoacrylate Hives   Wound Dressing Adhesive Hives   Atorvastatin Other (See Comments)   Leg pain and severe headaches   Demerol [meperidine]    Interacts with other medication, causes blood pressure to raise   Statins Other (See Comments)   Headaches/migraines   Other Swelling, Rash   dermabond        Medication List        Accurate as of April 08, 2024  1:01 PM. If you have any questions, ask your nurse or doctor.          anastrozole  1 MG tablet Commonly known as: ARIMIDEX  Take 1 tablet by mouth once daily   ARIPiprazole 15 MG tablet Commonly known as: ABILIFY Take by mouth.   aspirin 81 MG chewable tablet Chew 81 mg by mouth daily.   BARIATRIC MULTIVITAMINS/IRON PO Take by mouth.   bumetanide 1 MG tablet Commonly known as: BUMEX Take 1 mg by mouth daily as needed.   busPIRone 30 MG tablet Commonly known as: BUSPAR Take by mouth.   celecoxib 200 MG capsule Commonly known as: CELEBREX Take 200 mg by mouth 2 (two)  times daily as needed.   Cholecalciferol 50 MCG (2000 UT) Tabs Take 2,000 Units by mouth daily. Reported on 02/03/2016   ezetimibe 10 MG tablet Commonly known as: ZETIA Take 1 tablet by mouth daily.   fentaNYL  50 MCG/HR Commonly known as: DURAGESIC  1 patch daily.   lamoTRIgine 200 MG tablet Commonly known as: LAMICTAL Take 200 mg by mouth 2 (two) times daily. Reported on 09/22/2015   metoprolol succinate 50 MG 24 hr tablet Commonly known as: TOPROL-XL Take 1 tablet by mouth daily.   modafinil 200 MG tablet Commonly known as: PROVIGIL Take 200 mg by mouth 2 (two) times daily.   Narcan 4 MG/0.1ML Liqd nasal spray kit Generic drug: naloxone CALL 911. ADMINISTER A SINGLE SPRAY OF NARCAN IN ONE NOSTRIL. REPEAT EVERY 3 MINUTES AS NEEDED IF NO OR MINIMAL RESPONSE.   oxyCODONE  15 MG immediate release tablet Commonly known as: ROXICODONE  Take 15 mg  by mouth every 4 (four) hours.   pantoprazole 40 MG tablet Commonly known as: PROTONIX Take 1 tablet by mouth daily.   Relief Knee Misc Use as directed   sildenafil  100 MG tablet Commonly known as: VIAGRA  TAKE 1 TABLET BY MOUTH AS NEEDED FOR ERECTILE DYSFUNCTION   testosterone  cypionate 200 MG/ML injection Commonly known as: DEPOTESTOSTERONE CYPIONATE INJECT 0.7 MLS INTO THE MUSCLE ONCE A WEEK.   tranylcypromine 10 MG tablet Commonly known as: PARNATE Take 30 mg by mouth 3 (three) times daily. 9 tablets daily   VITAMIN B-12 SL Place under the tongue daily.   Zepbound 5 MG/0.5ML Pen Generic drug: tirzepatide Inject 12.5 mg into the skin.        Allergies:  Allergies  Allergen Reactions   Cyanoacrylate Hives   Wound Dressing Adhesive Hives   Atorvastatin Other (See Comments)    Leg pain and severe headaches   Demerol [Meperidine]     Interacts with other medication, causes blood pressure to raise   Statins Other (See Comments)    Headaches/migraines   Other Swelling and Rash    dermabond    Family History: Family History  Problem Relation Age of Onset   Arthritis Mother    Deep vein thrombosis Mother    Uterine cancer Mother    Heart disease Mother    Cirrhosis Sister    Heart disease Father    Diabetes Mellitus II Father        Mother, Sister   Parkinson's disease Father    Stroke Father    Kidney disease Neg Hx    Prostate cancer Neg Hx     Social History:  reports that he has never smoked. He has never used smokeless tobacco. He reports current alcohol use of about 2.0 standard drinks of alcohol per week. He reports that he does not use drugs.   Physical Exam: BP 133/85   Pulse 67   Ht 6' 3 (1.905 m)   Wt 279 lb (126.6 kg)   BMI 34.87 kg/m   Constitutional:  Alert, No acute distress. HEENT: [place] AT Respiratory: Normal respiratory effort, no increased work of breathing. GU: Declined DRE Psychiatric: Normal mood and affect.   Assessment &  Plan:    1.  Hypogonadism Complains of decreased libido testosterone  level is in a good range.  He has chronic medical conditions including chronic opiate therapy which may be the etiology of his libido Estradiol  level was added to labs drawn yesterday Lab visit 6 months testosterone , H/H Office visit 1 year testosterone , H/H, PSA  Rodney JAYSON Barba, MD  [facility] [address], [address] [place], [place] [address] [phone]

## 2024-04-10 ENCOUNTER — Ambulatory Visit: Payer: Self-pay | Admitting: Urology

## 2024-04-17 ENCOUNTER — Telehealth: Payer: Self-pay | Admitting: *Deleted

## 2024-04-17 ENCOUNTER — Other Ambulatory Visit: Payer: Self-pay | Admitting: *Deleted

## 2024-04-17 DIAGNOSIS — E291 Testicular hypofunction: Secondary | ICD-10-CM

## 2024-04-17 LAB — LUTEINIZING HORMONE

## 2024-04-17 NOTE — Telephone Encounter (Signed)
 Dr. Twylla sent a message and ask me to add on a LH to patent labs. So I went on to [facility] link to added on order. Todat I got a phone call for the lab  asking me a lot of question about this lab that I added on . I adivsed her she need to talk with Crystal about that. We was told we can use [facility] link to add on a lab instead of calling.   So the lab did not get added on . So I ordred the lab and advised patient to go have his lh drawn, Patient states he will.

## 2024-04-19 ENCOUNTER — Ambulatory Visit: Payer: Self-pay | Admitting: Urology

## 2024-05-09 NOTE — Progress Notes (Signed)
 [facility] [address] [place], [place] [address]  Pulmonary Sleep Medicine   Office Visit Note  Patient Name: Rodney Howard DOB: [birth year] MRN [id number]    Chief Complaint: Obstructive Sleep Apnea visit  Brief History:  Ashford is seen today for a follow up visit for BiPAP ASV@ EPAP min 10 max 15, PS min 6 max 15 cmH2O, RR auto. The patient has a 11+ year history of sleep apnea. Patient is not using PAP nightly.  The patient feels not as rested after sleeping with PAP mostly due to changes made to his pain medications and dosages.  The patient reports benefiting from PAP use. Reported sleepiness is improved with the use the PAP and the Epworth Sleepiness Score is 14 out of 24. The patient does take naps. The patient complains of the following: none.  The compliance download shows 52% compliance with an average use time of 4 hours 42 minutes. The AHI is 11.0.  The patient does not complain of limb movements disrupting sleep. The patient continues to require PAP therapy in order to eliminate sleep apnea.  ROS  General: (-) fever, (-) chills, (-) night sweat Nose and Sinuses: (-) nasal stuffiness or itchiness, (-) postnasal drip, (-) nosebleeds, (-) sinus trouble. Mouth and Throat: (-) sore throat, (-) hoarseness. Neck: (-) swollen glands, (-) enlarged thyroid, (-) neck pain. Respiratory: - cough, - shortness of breath, - wheezing. Neurologic: - numbness, - tingling. Psychiatric: + anxiety, + depression   Current Medication: Outpatient Encounter Medications as of 05/12/2024  Medication Sig   ARIPiprazole (ABILIFY) 15 MG tablet Take by mouth.   aspirin 81 MG chewable tablet Chew 81 mg by mouth daily.    bumetanide (BUMEX) 1 MG tablet Take 1 mg by mouth daily as needed.   celecoxib (CELEBREX) 200 MG capsule Take 200 mg by mouth 2 (two) times daily as needed.   Cholecalciferol 2000 UNITS TABS Take 2,000 Units by mouth daily. Reported on 02/03/2016   Cyanocobalamin  (VITAMIN B-12 SL) Place under the tongue daily.   Elastic Bandages & Supports (RELIEF KNEE) MISC Use as directed   ezetimibe (ZETIA) 10 MG tablet Take 1 tablet by mouth daily.   fentaNYL  (DURAGESIC ) 50 MCG/HR 1 patch daily.   ketoconazole (NIZORAL) 2 % cream SMARTSIG:sparingly Topical 1-2 Times Daily PRN   ketoconazole (NIZORAL) 2 % shampoo Apply topically once a week.   metoprolol succinate (TOPROL-XL) 25 MG 24 hr tablet Take 25 mg by mouth daily.   modafinil (PROVIGIL) 200 MG tablet Take 200 mg by mouth 2 (two) times daily.   Multiple Vitamins-Minerals (BARIATRIC MULTIVITAMINS/IRON PO) Take by mouth.   NARCAN 4 MG/0.1ML LIQD nasal spray kit CALL 911. ADMINISTER A SINGLE SPRAY OF NARCAN IN ONE NOSTRIL. REPEAT EVERY 3 MINUTES AS NEEDED IF NO OR MINIMAL RESPONSE.   oxyCODONE  (ROXICODONE ) 15 MG immediate release tablet Take 15 mg by mouth every 4 (four) hours.   pantoprazole (PROTONIX) 40 MG tablet Take 1 tablet by mouth daily.   sildenafil  (VIAGRA ) 100 MG tablet TAKE 1 TABLET BY MOUTH AS NEEDED FOR ERECTILE DYSFUNCTION   testosterone  cypionate (DEPOTESTOSTERONE CYPIONATE) 200 MG/ML injection INJECT 0.7 MLS INTO THE MUSCLE ONCE A WEEK.   tirzepatide (ZEPBOUND) 5 MG/0.5ML Pen Inject 12.5 mg into the skin.   tranylcypromine (PARNATE) 10 MG tablet Take 30 mg by mouth 3 (three) times daily. 9 tablets daily   [DISCONTINUED] anastrozole  (ARIMIDEX ) 1 MG tablet Take 1 tablet by mouth once daily   [DISCONTINUED] busPIRone (BUSPAR)  30 MG tablet Take by mouth.   [DISCONTINUED] lamoTRIgine (LAMICTAL) 200 MG tablet Take 200 mg by mouth 2 (two) times daily. Reported on 09/22/2015   [DISCONTINUED] metoprolol succinate (TOPROL-XL) 50 MG 24 hr tablet Take 1 tablet by mouth daily.   No facility-administered encounter medications on file as of 05/12/2024.    Surgical History: Past Surgical History:  Procedure Laterality Date   BACK SURGERY     CARDIAC CATHETERIZATION     COLONOSCOPY WITH PROPOFOL  N/A 06/25/2017    Procedure: COLONOSCOPY WITH PROPOFOL ;  Surgeon: Jinny Carmine, MD;  Location: [facility];  Service: Endoscopy;  Laterality: N/A;  sleep apnea   JOINT REPLACEMENT     x 3   LUMBAR DISC SURGERY     x 3   POLYPECTOMY N/A 06/25/2017   Procedure: POLYPECTOMY;  Surgeon: Jinny Carmine, MD;  Location: [facility];  Service: Endoscopy;  Laterality: N/A;   STOMACH SURGERY     gastric sleeve   TOE SURGERY     TOTAL KNEE ARTHROPLASTY Left 2014   UMBILICAL HERNIA REPAIR N/A 02/17/2016   Procedure: HERNIA REPAIR UMBILICAL ADULT;  Surgeon: Charlie FORBES Fell, MD;  Location: [facility];  Service: General;  Laterality: N/A;   WRIST SURGERY      Medical History: Past Medical History:  Diagnosis Date   ADD (attention deficit disorder)    Anxiety    Bipolar 1 disorder (HCC)    BPH (benign prostatic hyperplasia)    DDD (degenerative disc disease), cervical    Depression    Erectile dysfunction    GERD (gastroesophageal reflux disease)    Gout    HLD (hyperlipidemia)    HTN (hypertension)    Hypogonadism in male    Lymphedema    Nocturia    Obesity    Obesity    Osteoarthritis    Peripheral neuropathy    feet   S/P insertion of spinal cord stimulator    Sleep apnea    CPAP   Tinea cruris     Family History: Non contributory to the present illness  Social History: Social History   Socioeconomic History   Marital status: Married    Spouse name: Not on file   Number of children: Not on file   Years of education: Not on file   Highest education level: Not on file  Occupational History   Not on file  Tobacco Use   Smoking status: Never   Smokeless tobacco: Never  Vaping Use   Vaping status: Never Used  Substance and Sexual Activity   Alcohol use: Yes    Alcohol/week: 2.0 standard drinks of alcohol    Types: 2 Cans of beer per week    Comment: occasional   Drug use: No   Sexual activity: Yes  Other Topics Concern   Not on file  Social History Narrative   Not  on file   Social Drivers of Health   Financial Resource Strain: High Risk (12/30/2023)   Received from [facility]   Overall Financial Resource Strain (CARDIA)    Difficulty of Paying Living Expenses: Hard  Food Insecurity: No Food Insecurity (12/30/2023)   Received from [facility]   Hunger Vital Sign    Within the past 12 months, you worried that your food would run out before you got the money to buy more.: Never true    Within the past 12 months, the food you bought just didn't last and you didn't have money  to get more.: Never true  Transportation Needs: No Transportation Needs (12/30/2023)   Received from [facility] - Transportation    In the past 12 months, has lack of transportation kept you from medical appointments or from getting medications?: No    Lack of Transportation (Non-Medical): No  Physical Activity: Not on file  Stress: Not on file  Social Connections: Not on file  Intimate Partner Violence: Not on file    Vital Signs: Blood pressure 131/87, pulse 82, resp. rate 16, height 6' 3 (1.905 m), weight 281 lb (127.5 kg), SpO2 97%. Body mass index is 35.12 kg/m.    Examination: General Appearance: The patient is well-developed, well-nourished, and in no distress. Neck Circumference: 47 cm Skin: Gross inspection of skin unremarkable. Head: normocephalic, no gross deformities. Eyes: no gross deformities noted. ENT: ears appear grossly normal Neurologic: Alert and oriented. No involuntary movements.  STOP BANG RISK ASSESSMENT S (snore) Have you been told that you snore?     YES   T (tired) Are you often tired, fatigued, or sleepy during the day?   YES  O (obstruction) Do you stop breathing, choke, or gasp during sleep? NO   P (pressure) Do you have or are you being treated for high blood pressure? YES   B (BMI) Is your body index greater than 35 kg/m? YES   A (age) Are you 48 years old or older?  YES   N (neck) Do you have a neck circumference greater than 16 inches?   YES   G (gender) Are you a male? YES   TOTAL STOP/BANG "YES" ANSWERS 7       A STOP-Bang score of 2 or less is considered low risk, and a score of 5 or more is high risk for having either moderate or severe OSA. For people who score 3 or 4, doctors may need to perform further assessment to determine how likely they are to have OSA.         EPWORTH SLEEPINESS SCALE:  Scale:  (0)= no chance of dozing; (1)= slight chance of dozing; (2)= moderate chance of dozing; (3)= high chance of dozing  Chance  Situtation    Sitting and reading: 2    Watching TV: 2    Sitting Inactive in public: 2    As a passenger in car: 1      Lying down to rest: 3    Sitting and talking: 2    Sitting quielty after lunch: 2    In a car, stopped in traffic: 0   TOTAL SCORE:   14 out of 24    SLEEP STUDIES:  PSG - 02/2022 - AHI - 99.8/hr, REM AHI 132.2/hr, min Sp02 81% Titration - 03/2022 2 week trial of BiPAP ASV @ EPAP min 8 max 15, PS min 6 max 15, max press 25   CPAP COMPLIANCE DATA:  Date Range: 11/05/2023-05/07/2024  Average Daily Use: 4 hours 42 minutes  Median Use: 4 hours 50 minutes  Compliance for > 4 Hours: 52%  AHI: 11.0 respiratory events per hour  Days Used: 156/185 days  Mask Leak: 74  95th Percentile Pressure: 22.7/12.4         LABS: Recent Results (from the past 2160 hours)  PSA     Status: None   Collection Time: 04/07/24  9:23 AM  Result Value Ref Range   Prostatic Specific Antigen 0.55 0.00 - 4.00 ng/mL    Comment: (NOTE) While PSA  levels of <=4.00 ng/ml are reported as reference range, some men with levels below 4.00 ng/ml can have prostate cancer and many men with PSA above 4.00 ng/ml do not have prostate cancer.  Other tests such as free PSA, age specific reference ranges, PSA velocity and PSA doubling time may be helpful especially in men less than 45  years old. Performed at [facility], [address] [place], [place] [address]   Hemoglobin and hematocrit, blood     Status: None   Collection Time: 04/07/24  9:23 AM  Result Value Ref Range   Hemoglobin 15.9 13.0 - 17.0 g/dL   HCT 55.1 60.9 - 47.9 %    Comment: Performed at [facility], [address] [place], [place] [address]  Testosterone      Status: None   Collection Time: 04/07/24  9:23 AM  Result Value Ref Range   Testosterone  914 264 - 916 ng/dL    Comment: (NOTE) Adult male reference interval is based on a population of healthy nonobese males (BMI <30) between 59 and 22 years old. Travison, et.al. JCEM [phone]. PMID: [id number]. Performed At: [facility] [address] [place], [place] [id number] Jennette Shorter MD Ey:[phone]   Luteinizing hormone     Status: None   Collection Time: 04/07/24  9:23 AM  Result Value Ref Range   LH QNSTST mIU/mL    Comment: (NOTE) Test not performed. Insufficient specimen to perform or complete analysis. Notified Corean HUNT 04/17/2024 Performed At: [facility] [address] [place], [place] [id number] Jennette Shorter MD Ey:[phone]     Radiology: No results found.  No results found.  No results found.    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   Superficial thrombophlebitis 10/16/2018   Chronic constipation 08/15/2018   Hemorrhoids that prolapse with straining, but retract spontaneously 08/15/2018   Long term  current use of opiate analgesic 08/15/2018   Chronic venous insufficiency 02/26/2018   ADD (attention deficit disorder) 01/15/2018   Seborrheic dermatitis 12/05/2017   Obesity 10/26/2017   Lymphedema 10/26/2017   History of colonic polyps    Rectal polyp    Postlaminectomy syndrome, lumbar 07/04/2016   Spondylolisthesis of lumbar region 07/04/2016   Peripheral nerve disease 03/01/2016   Umbilical hernia without obstruction and without gangrene    Pure hypercholesterolemia 02/01/2016   Arthritis, degenerative 02/01/2016   Adiposity 02/01/2016   Gout 02/01/2016   Acid reflux 02/01/2016   Essential (primary) hypertension 02/01/2016   Narrowing of intervertebral disc space 02/01/2016   Bipolar I disorder, most recent episode depressed (HCC) 02/01/2016   Other specified behavioral and emotional disorders with onset usually occurring in childhood and adolescence 02/01/2016   Encounter for preprocedural cardiovascular examination 11/09/2015   H/O total knee replacement 10/25/2015   Morbid obesity (HCC) 10/25/2015   Loosening of knee joint prosthesis 10/25/2015   Hypogonadism in male 09/22/2015   Erectile dysfunction of organic origin 09/22/2015   BPH with obstruction/lower urinary tract symptoms 09/22/2015   History of surgical procedure 04/22/2015  Acquired lymphedema 02/23/2014   Obstructive apnea 10/13/2013   Fatty infiltration of liver 10/07/2012   Arteriosclerosis of coronary artery 10/07/2012   CAD (coronary artery disease) 10/07/2012   1. Central sleep apnea (Primary) The patient does tolerate PAP and reports  benefit from PAP use. However, his apnea remains uncontrolled. He is willing to do a titration so that will be done. The patient was reminded how to clean equipment  and advised to replace supplies routinely. The patient was also counselled on weight loss. The compliance is fair. The AHI is 11.   Central sleep apnea- not controlled. Bipap asv titration. F/u after  setup  2. Encounter for BiPAP use counseling PAP Counseling: had a lengthy discussion with the patient regarding the importance of PAP therapy in management of the sleep apnea. Patient appears to understand the risk factor reduction and also understands the risks associated with untreated sleep apnea. Patient will try to make a good faith effort to remain compliant with therapy. Also instructed the patient on proper cleaning of the device including the water  must be changed daily if possible and use of distilled water  is preferred. Patient understands that the machine should be regularly cleaned with appropriate recommended cleaning solutions that do not damage the PAP machine for example given white vinegar and water  rinses. Other methods such as ozone treatment may not be as good as these simple methods to achieve cleaning.     The patient does tolerate PAP and reports  benefit from PAP use. However, his apnea remains uncontrolled. He is willing to do a titration so that will be done. The patient was reminded how to clean equipment  and advised to replace supplies routinely. The patient was also counselled on weight loss. The compliance is fair. The AHI is 11.   Central sleep apnea- not controlled. Bipap asv titration. F/u after setup  3. Chronic back pain- he is having tremendous issues with his back pain which require the use of opiates, which complicates his apnea. Continue f/u with pain management.   General Counseling: I have discussed the findings of the evaluation and examination with Jerel.  I have also discussed any further diagnostic evaluation thatmay be needed or ordered today. Luccas verbalizes understanding of the findings of todays visit. We also reviewed his medications today and discussed drug interactions and side effects including but not limited excessive drowsiness and altered mental states. We also discussed that there is always a risk not just to him but also people around him.  he has been encouraged to call the office with any questions or concerns that should arise related to todays visit.  No orders of the defined types were placed in this encounter.       I have personally obtained a history, examined the patient, evaluated laboratory and imaging results, formulated the assessment and plan and placed orders. This patient was seen today by Lauraine Lay, PA-C in collaboration with Dr. Elfreda Bathe.   Elfreda DELENA Bathe, MD [facility] Diplomate ABMS Pulmonary Critical Care Medicine and Sleep Medicine

## 2024-05-12 ENCOUNTER — Ambulatory Visit (INDEPENDENT_AMBULATORY_CARE_PROVIDER_SITE_OTHER): Admitting: Internal Medicine

## 2024-05-12 VITALS — BP 131/87 | HR 82 | Resp 16 | Ht 75.0 in | Wt 281.0 lb

## 2024-05-12 DIAGNOSIS — Z7189 Other specified counseling: Secondary | ICD-10-CM | POA: Diagnosis not present

## 2024-05-12 DIAGNOSIS — G8929 Other chronic pain: Secondary | ICD-10-CM

## 2024-05-12 DIAGNOSIS — M545 Low back pain, unspecified: Secondary | ICD-10-CM

## 2024-05-12 DIAGNOSIS — G4731 Primary central sleep apnea: Secondary | ICD-10-CM

## 2024-05-12 NOTE — Patient Instructions (Signed)

## 2024-08-18 DIAGNOSIS — E291 Testicular hypofunction: Secondary | ICD-10-CM

## 2024-10-09 ENCOUNTER — Other Ambulatory Visit: Payer: PRIVATE HEALTH INSURANCE

## 2025-04-08 ENCOUNTER — Other Ambulatory Visit: Payer: PRIVATE HEALTH INSURANCE

## 2025-04-15 ENCOUNTER — Ambulatory Visit: Payer: PRIVATE HEALTH INSURANCE | Admitting: Urology
